# Patient Record
Sex: Male | Born: 1970 | Race: White | Hispanic: No | Smoking: Never smoker
Health system: Southern US, Community
[De-identification: ages and names within clinical notes are randomized; demographics above are authoritative.]

## PROBLEM LIST (undated history)

## (undated) DIAGNOSIS — I10 Essential (primary) hypertension: Secondary | ICD-10-CM

## (undated) DIAGNOSIS — F0280 Dementia in other diseases classified elsewhere without behavioral disturbance: Secondary | ICD-10-CM

## (undated) DIAGNOSIS — G4733 Obstructive sleep apnea (adult) (pediatric): Secondary | ICD-10-CM

## (undated) DIAGNOSIS — N200 Calculus of kidney: Secondary | ICD-10-CM

## (undated) DIAGNOSIS — R5600 Simple febrile convulsions: Secondary | ICD-10-CM

## (undated) DIAGNOSIS — G319 Degenerative disease of nervous system, unspecified: Secondary | ICD-10-CM

## (undated) DIAGNOSIS — E785 Hyperlipidemia, unspecified: Secondary | ICD-10-CM

## (undated) HISTORY — DX: Simple febrile convulsions: R56.00

## (undated) HISTORY — DX: Obstructive sleep apnea (adult) (pediatric): G47.33

## (undated) HISTORY — PX: LITHOTRIPSY: SUR834

## (undated) HISTORY — DX: Essential (primary) hypertension: I10

## (undated) HISTORY — DX: Hyperlipidemia, unspecified: E78.5

## (undated) HISTORY — DX: Calculus of kidney: N20.0

## (undated) HISTORY — DX: Dementia in other diseases classified elsewhere without behavioral disturbance: F02.80

## (undated) HISTORY — DX: Degenerative disease of nervous system, unspecified: G31.9

---

## 2004-07-08 ENCOUNTER — Emergency Department: Payer: Self-pay | Admitting: Emergency Medicine

## 2004-07-13 ENCOUNTER — Emergency Department: Payer: Self-pay | Admitting: Emergency Medicine

## 2013-05-20 DIAGNOSIS — N200 Calculus of kidney: Secondary | ICD-10-CM | POA: Insufficient documentation

## 2015-06-05 DIAGNOSIS — E785 Hyperlipidemia, unspecified: Secondary | ICD-10-CM | POA: Insufficient documentation

## 2015-06-05 DIAGNOSIS — E782 Mixed hyperlipidemia: Secondary | ICD-10-CM | POA: Insufficient documentation

## 2015-06-05 DIAGNOSIS — I10 Essential (primary) hypertension: Secondary | ICD-10-CM | POA: Insufficient documentation

## 2015-06-05 HISTORY — DX: Mixed hyperlipidemia: E78.2

## 2015-06-05 HISTORY — DX: Essential (primary) hypertension: I10

## 2017-06-03 DIAGNOSIS — Z0000 Encounter for general adult medical examination without abnormal findings: Secondary | ICD-10-CM | POA: Diagnosis not present

## 2017-06-03 DIAGNOSIS — G4710 Hypersomnia, unspecified: Secondary | ICD-10-CM | POA: Diagnosis not present

## 2017-08-04 DIAGNOSIS — H401131 Primary open-angle glaucoma, bilateral, mild stage: Secondary | ICD-10-CM | POA: Diagnosis not present

## 2017-09-18 DIAGNOSIS — H401131 Primary open-angle glaucoma, bilateral, mild stage: Secondary | ICD-10-CM | POA: Diagnosis not present

## 2017-11-07 DIAGNOSIS — I10 Essential (primary) hypertension: Secondary | ICD-10-CM | POA: Diagnosis not present

## 2017-11-17 DIAGNOSIS — E782 Mixed hyperlipidemia: Secondary | ICD-10-CM | POA: Diagnosis not present

## 2017-11-17 DIAGNOSIS — I10 Essential (primary) hypertension: Secondary | ICD-10-CM | POA: Diagnosis not present

## 2017-11-17 DIAGNOSIS — G4733 Obstructive sleep apnea (adult) (pediatric): Secondary | ICD-10-CM | POA: Diagnosis not present

## 2017-12-25 DIAGNOSIS — G4733 Obstructive sleep apnea (adult) (pediatric): Secondary | ICD-10-CM | POA: Diagnosis not present

## 2017-12-28 DIAGNOSIS — G4733 Obstructive sleep apnea (adult) (pediatric): Secondary | ICD-10-CM | POA: Diagnosis not present

## 2018-05-13 DIAGNOSIS — E782 Mixed hyperlipidemia: Secondary | ICD-10-CM | POA: Diagnosis not present

## 2018-05-20 DIAGNOSIS — Z0000 Encounter for general adult medical examination without abnormal findings: Secondary | ICD-10-CM | POA: Diagnosis not present

## 2018-05-20 DIAGNOSIS — G4733 Obstructive sleep apnea (adult) (pediatric): Secondary | ICD-10-CM | POA: Insufficient documentation

## 2018-06-04 DIAGNOSIS — M25562 Pain in left knee: Secondary | ICD-10-CM | POA: Diagnosis not present

## 2018-06-04 DIAGNOSIS — M25561 Pain in right knee: Secondary | ICD-10-CM | POA: Diagnosis not present

## 2018-06-04 DIAGNOSIS — R42 Dizziness and giddiness: Secondary | ICD-10-CM | POA: Diagnosis not present

## 2018-06-04 DIAGNOSIS — J302 Other seasonal allergic rhinitis: Secondary | ICD-10-CM | POA: Diagnosis not present

## 2018-11-18 ENCOUNTER — Emergency Department: Payer: BC Managed Care – PPO

## 2018-11-18 ENCOUNTER — Encounter: Payer: Self-pay | Admitting: Emergency Medicine

## 2018-11-18 ENCOUNTER — Other Ambulatory Visit: Payer: Self-pay

## 2018-11-18 ENCOUNTER — Emergency Department
Admission: EM | Admit: 2018-11-18 | Discharge: 2018-11-18 | Disposition: A | Discharge status: Home / Self Care | Payer: BC Managed Care – PPO | Attending: Student in an Organized Health Care Education/Training Program | Admitting: Student in an Organized Health Care Education/Training Program

## 2018-11-18 DIAGNOSIS — R42 Dizziness and giddiness: Secondary | ICD-10-CM | POA: Diagnosis not present

## 2018-11-18 DIAGNOSIS — Z733 Stress, not elsewhere classified: Secondary | ICD-10-CM | POA: Insufficient documentation

## 2018-11-18 DIAGNOSIS — Z20828 Contact with and (suspected) exposure to other viral communicable diseases: Secondary | ICD-10-CM | POA: Diagnosis not present

## 2018-11-18 DIAGNOSIS — R0602 Shortness of breath: Secondary | ICD-10-CM | POA: Diagnosis present

## 2018-11-18 DIAGNOSIS — M7989 Other specified soft tissue disorders: Secondary | ICD-10-CM | POA: Diagnosis not present

## 2018-11-18 DIAGNOSIS — R000 Tachycardia, unspecified: Secondary | ICD-10-CM | POA: Diagnosis not present

## 2018-11-18 LAB — CBC
HCT: 42.8 % (ref 39.0–52.0)
Hemoglobin: 14.2 g/dL (ref 13.0–17.0)
MCH: 29 pg (ref 26.0–34.0)
MCHC: 33.2 g/dL (ref 30.0–36.0)
MCV: 87.5 fL (ref 80.0–100.0)
Platelets: 466 10*3/uL — ABNORMAL HIGH (ref 150–400)
RBC: 4.89 MIL/uL (ref 4.22–5.81)
RDW: 12.2 % (ref 11.5–15.5)
WBC: 7.8 10*3/uL (ref 4.0–10.5)
nRBC: 0 % (ref 0.0–0.2)

## 2018-11-18 LAB — BASIC METABOLIC PANEL
Anion gap: 12 (ref 5–15)
BUN: 16 mg/dL (ref 6–20)
CO2: 23 mmol/L (ref 22–32)
Calcium: 9.3 mg/dL (ref 8.9–10.3)
Chloride: 103 mmol/L (ref 98–111)
Creatinine, Ser: 0.87 mg/dL (ref 0.61–1.24)
GFR calc Af Amer: >60 mL/min (ref >60)
GFR calc non Af Amer: >60 mL/min (ref >60)
Glucose, Bld: 112 mg/dL — ABNORMAL HIGH (ref 70–99)
Potassium: 4 mmol/L (ref 3.5–5.1)
Sodium: 138 mmol/L (ref 135–145)

## 2018-11-18 LAB — URINALYSIS, COMPLETE (UACMP) WITH MICROSCOPIC
Bacteria, UA: NONE SEEN
Bilirubin Urine: NEGATIVE
Glucose, UA: NEGATIVE mg/dL
Hgb urine dipstick: NEGATIVE
Ketones, ur: NEGATIVE mg/dL
Leukocytes,Ua: NEGATIVE
Nitrite: NEGATIVE
Protein, ur: NEGATIVE mg/dL
Specific Gravity, Urine: 1.021 (ref 1.005–1.030)
Squamous Epithelial / HPF: NONE SEEN (ref 0–5)
pH: 5 (ref 5.0–8.0)

## 2018-11-18 LAB — TROPONIN I (HIGH SENSITIVITY): Troponin I (High Sensitivity): 4 ng/L (ref <18)

## 2018-11-18 LAB — FIBRIN DERIVATIVES D-DIMER (ARMC ONLY): Fibrin derivatives D-dimer (ARMC): 2112.79 ng/mL (FEU) — ABNORMAL HIGH (ref 0.00–499.00)

## 2018-11-18 LAB — BRAIN NATRIURETIC PEPTIDE: B Natriuretic Peptide: 19 pg/mL (ref 0.0–100.0)

## 2018-11-18 MED ORDER — ALBUTEROL SULFATE HFA 108 (90 BASE) MCG/ACT IN AERS
2.0000 | INHALATION_SPRAY | Freq: Once | RESPIRATORY_TRACT | Status: AC
  Administered 2018-11-18: 19:00:00 2 via RESPIRATORY_TRACT
  Filled 2018-11-18 (×2): qty 6.7

## 2018-11-18 MED ORDER — SODIUM CHLORIDE 0.9 % IV BOLUS
500.0000 mL | Freq: Once | INTRAVENOUS | Status: AC
  Administered 2018-11-18: 19:00:00 500 mL via INTRAVENOUS

## 2018-11-18 MED ORDER — IOHEXOL 350 MG/ML SOLN
75.0000 mL | Freq: Once | INTRAVENOUS | Status: AC | PRN
  Administered 2018-11-18: 19:00:00 75 mL via INTRAVENOUS

## 2018-11-18 NOTE — ED Triage Notes (Signed)
PT from [facility] with c/o SOB and dizziness. Per [place] 95% on RA but became dizzy and tachycardic with ambulation. PT states symptoms started today. PT denies any CP , fevers or cough

## 2018-11-18 NOTE — ED Notes (Signed)
ED Provider at bedside. 

## 2018-11-18 NOTE — Discharge Instructions (Addendum)
Your blood tests, CT scan of the chest, and ultrasound of the legs were all unremarkable today.  We did not find any blood clots in your body that might explain your symptoms.  One blood test called D-dimer was elevated at 2100.  This finding could possibly be due to Covid infection, and if this test comes back positive when the result is available later tomorrow, we will give you a call.  In the meantime, please quarantine yourself at home until the test result is known.

## 2018-11-18 NOTE — ED Notes (Signed)
X-ray at bedside to perform chest X-ray.  

## 2018-11-18 NOTE — ED Notes (Signed)
[place] tech in with patient to preform [place].

## 2018-11-18 NOTE — ED Notes (Signed)
Pt ambulated in room. O2 sats remained above 99%

## 2018-11-18 NOTE — ED Notes (Signed)
Called pharmacy regarding albuterol inhaler. Still has not been tubed to ED. Pharmacy states will tube to ED.

## 2018-11-18 NOTE — ED Notes (Signed)
Message sent to pharmacy regarding pt's inhaler.

## 2018-11-18 NOTE — ED Notes (Signed)
Pt presents to ED via wheelchair from [facility]. Pt states at lunch today he noticed he was becoming SOB and dizzy while walking. Pt states only thing that makes the dizziness worse is ambulating, pt able to speak in full and complete sentences at this time without difficulty. Pt is A&O x 4, NAD noted at this time.

## 2018-11-18 NOTE — ED Provider Notes (Signed)
Procedures  Clinical Course as of Nov 18 2043  Wed Nov 18, 2018  1905 Work-up thus far is quite reassuring.  I do not appreciate any saddle PE on CT.  We will also order ultrasound of lower extremities.  Will wait for formal.  Patient does not have any dyspnea on exertion.  No tachycardia no hypoxia.  Anticipate if CT imaging is negative patient will be appropriate for discharge home.   [PR]    Clinical Course User Index [PR] Merlyn Lot, MD    ----------------------------------------- 8:45 PM on 11/18/2018 -----------------------------------------  Assumed care from Dr. Quentin Cornwall.  Ultrasound is negative.  Vital signs unremarkable, patient's breathing comfortably with normal oxygen saturation.  Patient is calm and sitting upright and not in distress, stable for discharge home for outpatient follow-up.  Advised to quarantine at home pending Covid test result.   Carrie Mew, MD 11/18/18 2045

## 2018-11-18 NOTE — ED Provider Notes (Signed)
[facility] Emergency Department Provider Note    First MD Initiated Contact with Patient 11/18/18 1623     (approximate)  I have reviewed the triage vital signs and the nursing notes.   HISTORY  Chief Complaint Shortness of Breath and Dizziness    HPI Chad Stone is a 48 y.o. male presents to the ER for evaluation of shortness of breath lightheadedness dizziness and exertional dyspnea.  States he is also been feeling overwhelmed as he has been on over going through quite a bit of stress at home.  [place] [facility] because with ambulation patient became lightheaded and was reportedly tachycardic.  Denies any chest pain.  No history of asthma.  Denies any history of heart disease.  No recent surgeries.  Does not have any fevers.  No productive cough.    History reviewed. No pertinent past medical history. No family history on file. History reviewed. No pertinent surgical history. There are no active problems to display for this patient.     Prior to Admission medications   Not on File    Allergies Patient has no known allergies.    Social History Social History   Tobacco Use  . Smoking status: Never Smoker  . Smokeless tobacco: Never Used  Substance Use Topics  . Alcohol use: Not Currently  . Drug use: Never    Review of Systems Patient denies headaches, rhinorrhea, blurry vision, numbness, shortness of breath, chest pain, edema, cough, abdominal pain, nausea, vomiting, diarrhea, dysuria, fevers, rashes or hallucinations unless otherwise stated above in HPI. ____________________________________________   PHYSICAL EXAM:  VITAL SIGNS: Vitals:   11/18/18 1900 11/18/18 1915  BP: (!) 133/101   Pulse: 79 74  Resp: 16 20  Temp:    SpO2: 100% 100%    Constitutional: Alert and oriented.  Eyes: Conjunctivae are normal.  Head: Atraumatic. Nose: No congestion/rhinnorhea. Mouth/Throat: Mucous membranes are  moist.   Neck: No stridor. Painless ROM.  Cardiovascular: Normal rate, regular rhythm. Grossly normal heart sounds.  Good peripheral circulation. Respiratory: Normal respiratory effort.  No retractions. Lungs CTAB. Gastrointestinal: Soft and nontender. No distention. No abdominal bruits. No CVA tenderness. Genitourinary:  Musculoskeletal: No lower extremity tenderness nor edema.  No joint effusions. Neurologic:  Normal speech and language. No gross focal neurologic deficits are appreciated. No facial droop Skin:  Skin is warm, dry and intact. No rash noted. Psychiatric: Mood and affect are normal. Speech and behavior are normal.  ____________________________________________   LABS (all labs ordered are listed, but only abnormal results are displayed)  Results for orders placed or performed during the hospital encounter of 11/18/18 (from the past 24 hour(s))  Basic metabolic panel     Status: Abnormal   Collection Time: 11/18/18  1:33 PM  Result Value Ref Range   Sodium 138 135 - 145 mmol/L   Potassium 4.0 3.5 - 5.1 mmol/L   Chloride 103 98 - 111 mmol/L   CO2 23 22 - 32 mmol/L   Glucose, Bld 112 (H) 70 - 99 mg/dL   BUN 16 6 - 20 mg/dL   Creatinine, Ser 1.610.87 0.61 - 1.24 mg/dL   Calcium 9.3 8.9 - 09.610.3 mg/dL   GFR calc non Af Amer >60 >60 mL/min   GFR calc Af Amer >60 >60 mL/min   Anion gap 12 5 - 15  CBC     Status: Abnormal   Collection Time: 11/18/18  1:33 PM  Result Value Ref Range  WBC 7.8 4.0 - 10.5 K/uL   RBC 4.89 4.22 - 5.81 MIL/uL   Hemoglobin 14.2 13.0 - 17.0 g/dL   HCT 97.3 53.2 - 99.2 %   MCV 87.5 80.0 - 100.0 fL   MCH 29.0 26.0 - 34.0 pg   MCHC 33.2 30.0 - 36.0 g/dL   RDW 42.6 83.4 - 19.6 %   Platelets 466 (H) 150 - 400 K/uL   nRBC 0.0 0.0 - 0.2 %  Urinalysis, Complete w Microscopic     Status: Abnormal   Collection Time: 11/18/18  5:28 PM  Result Value Ref Range   Color, Urine YELLOW (A) YELLOW   APPearance CLEAR (A) CLEAR   Specific Gravity, Urine 1.021  1.005 - 1.030   pH 5.0 5.0 - 8.0   Glucose, UA NEGATIVE NEGATIVE mg/dL   Hgb urine dipstick NEGATIVE NEGATIVE   Bilirubin Urine NEGATIVE NEGATIVE   Ketones, ur NEGATIVE NEGATIVE mg/dL   Protein, ur NEGATIVE NEGATIVE mg/dL   Nitrite NEGATIVE NEGATIVE   Leukocytes,Ua NEGATIVE NEGATIVE   RBC / HPF 0-5 0 - 5 RBC/hpf   WBC, UA 0-5 0 - 5 WBC/hpf   Bacteria, UA NONE SEEN NONE SEEN   Squamous Epithelial / LPF NONE SEEN 0 - 5   Mucus PRESENT   Brain natriuretic peptide     Status: None   Collection Time: 11/18/18  5:28 PM  Result Value Ref Range   B Natriuretic Peptide 19.0 0.0 - 100.0 pg/mL  Troponin I (High Sensitivity)     Status: None   Collection Time: 11/18/18  5:28 PM  Result Value Ref Range   Troponin I (High Sensitivity) 4 <18 ng/L  Fibrin derivatives D-Dimer ([facility] only)     Status: Abnormal   Collection Time: 11/18/18  5:28 PM  Result Value Ref Range   Fibrin derivatives D-dimer ([facility]) 2,112.79 (H) 0.00 - 499.00 ng/mL (FEU)   ____________________________________________  EKG My review and personal interpretation at Time: 13:32   Indication: sob  Rate: 85  Rhythm: sinus Axis: normal Other: poor r wave progression, no stemi ____________________________________________  RADIOLOGY  I personally reviewed all radiographic images ordered to evaluate for the above acute complaints and reviewed radiology reports and findings.  These findings were personally discussed with the patient.  Please see medical record for radiology report.  ____________________________________________   PROCEDURES  Procedure(s) performed:  Procedures    Critical Care performed: no ____________________________________________   INITIAL IMPRESSION / ASSESSMENT AND PLAN / ED COURSE  Pertinent labs & imaging results that were available during my care of the patient were reviewed by me and considered in my medical decision making (see chart for details).   DDX: Asthma, copd, CHF, pna, ptx,  malignancy, Pe, anemia   Chad Stone is a 48 y.o. who presents to the ED with symptoms as described above.  Blood work and imaging will be sent for the above differential.  He appears well and nontoxic.  No hypoxia no tachycardia.  Do not appreciate any significant wheezing.  Does have some trace pedal edema.  No known sick contacts.  Will screen for Covid.  No evidence of acute ischemia on his EKG and the patient is without chest pain or pressure.  No palpitations.  The patient will be placed on continuous pulse oximetry and telemetry for monitoring.  Laboratory evaluation will be sent to evaluate for the above complaints.   .  Clinical Course as of Nov 17 2013  Wed Nov 18, 2018  2229 Work-up  thus far is quite reassuring.  I do not appreciate any saddle PE on CT.  We will also order ultrasound of lower extremities.  Will wait for formal.  Patient does not have any dyspnea on exertion.  No tachycardia no hypoxia.  Anticipate if CT imaging is negative patient will be appropriate for discharge home.   [PR]    Clinical Course User Index [PR] Merlyn Lot, MD    Patient family updated on results.  Patient signed out to oncoming physician pending results of ultrasound.  Anticipate discharge home if negative.  The patient was evaluated in Emergency Department today for the symptoms described in the history of present illness. He/she was evaluated in the context of the global COVID-19 pandemic, which necessitated consideration that the patient might be at risk for infection with the SARS-CoV-2 virus that causes COVID-19. Institutional protocols and algorithms that pertain to the evaluation of patients at risk for COVID-19 are in a state of rapid change based on information released by regulatory bodies including the CDC and federal and state organizations. These policies and algorithms were followed during the patient's care in the ED.  As part of my medical decision making, I reviewed the  following data within the [place] notes reviewed and incorporated, Labs reviewed, notes from prior ED visits and [place] Controlled Substance Database   ____________________________________________   FINAL CLINICAL IMPRESSION(S) / ED DIAGNOSES  Final diagnoses:  Shortness of breath      NEW MEDICATIONS STARTED DURING THIS VISIT:  New Prescriptions   No medications on file     Note:  This document was prepared using Dragon voice recognition software and may include unintentional dictation errors.    Merlyn Lot, MD 11/18/18 2017

## 2018-11-18 NOTE — ED Notes (Signed)
Patient transported to CT 

## 2018-11-19 LAB — SARS CORONAVIRUS 2 (TAT 6-24 HRS): SARS Coronavirus 2: NEGATIVE

## 2018-11-27 ENCOUNTER — Other Ambulatory Visit: Payer: Self-pay | Admitting: Internal Medicine

## 2018-11-27 DIAGNOSIS — G9349 Other encephalopathy: Secondary | ICD-10-CM

## 2018-11-27 DIAGNOSIS — R410 Disorientation, unspecified: Secondary | ICD-10-CM

## 2018-12-11 ENCOUNTER — Other Ambulatory Visit: Payer: Self-pay

## 2018-12-11 ENCOUNTER — Ambulatory Visit
Admission: RE | Admit: 2018-12-11 | Discharge: 2018-12-11 | Disposition: A | Discharge status: Home / Self Care | Payer: BC Managed Care – PPO | Source: Ambulatory Visit | Attending: Internal Medicine | Admitting: Internal Medicine

## 2018-12-11 DIAGNOSIS — G9349 Other encephalopathy: Secondary | ICD-10-CM | POA: Insufficient documentation

## 2018-12-11 DIAGNOSIS — R410 Disorientation, unspecified: Secondary | ICD-10-CM | POA: Diagnosis not present

## 2018-12-11 MED ORDER — GADOBUTROL 1 MMOL/ML IV SOLN
10.0000 mL | Freq: Once | INTRAVENOUS | Status: AC | PRN
  Administered 2018-12-11: 10 mL via INTRAVENOUS

## 2018-12-14 DIAGNOSIS — G319 Degenerative disease of nervous system, unspecified: Secondary | ICD-10-CM | POA: Insufficient documentation

## 2018-12-14 HISTORY — DX: Degenerative disease of nervous system, unspecified: G31.9

## 2018-12-16 ENCOUNTER — Other Ambulatory Visit: Payer: Self-pay | Admitting: Internal Medicine

## 2018-12-16 ENCOUNTER — Other Ambulatory Visit (HOSPITAL_COMMUNITY): Payer: Self-pay | Admitting: Internal Medicine

## 2018-12-16 DIAGNOSIS — M5116 Intervertebral disc disorders with radiculopathy, lumbar region: Secondary | ICD-10-CM

## 2018-12-17 ENCOUNTER — Ambulatory Visit
Admission: RE | Admit: 2018-12-17 | Discharge: 2018-12-17 | Disposition: A | Discharge status: Home / Self Care | Payer: BC Managed Care – PPO | Source: Ambulatory Visit | Attending: Internal Medicine | Admitting: Internal Medicine

## 2018-12-17 ENCOUNTER — Other Ambulatory Visit: Payer: Self-pay

## 2018-12-17 DIAGNOSIS — M5116 Intervertebral disc disorders with radiculopathy, lumbar region: Secondary | ICD-10-CM | POA: Diagnosis present

## 2018-12-21 NOTE — Progress Notes (Signed)
Cardiology Office Note  Date:  12/22/2018   ID:  Chad Stone, DOB October 28, 1970, MRN [id number]  PCP:  Chad Aus, MD   Chief Complaint  Patient presents with  . other    Self ref for arrhythmia showing on an EKG with Dr. Emily Stone. Meds reviewed by the pt. verbally. Pt. c/o shortness of breath and chest discomfort.     HPI:  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 Left frontal meningioma measuring 1 cm in size.  Echocardiogram performed to primary care, [place] NORMAL LEFT VENTRICULAR SYSTOLIC FUNCTION NORMAL RIGHT VENTRICULAR SYSTOLIC FUNCTION MILD VALVULAR REGURGITATION (See above) NO VALVULAR STENOSIS  Total chol higher, 265, LDL 180  EKG personally reviewed by myself on todays visit NSr rate 84 no ST or T wave changes  PMH:   has a past medical history of Benign essential HTN, Cerebral atrophy, mild ([place]), Hyperlipidemia, Nephrolithiasis, and OSA (obstructive sleep apnea).  PSH:    Past Surgical History:  Procedure Laterality Date  . LITHOTRIPSY      Current Outpatient Medications  Medication Sig Dispense Refill  . albuterol (VENTOLIN HFA) 108 (90 Base) MCG/ACT inhaler Inhale 1-2 puffs into the lungs every 4 (four) hours as needed.     [place] amphetamine-dextroamphetamine (ADDERALL) 10 MG tablet Take 10 mg by mouth daily with breakfast.     . azelastine (ASTELIN) 0.1 % nasal spray Place 1 spray into both nostrils 2  (two) times daily.     [place] escitalopram (LEXAPRO) 10 MG tablet Take 10 mg by mouth daily.    [place] etodolac (LODINE) 500 MG tablet Take 500 mg by mouth 2 (two) times daily.    [place] latanoprost (XALATAN) 0.005 % ophthalmic solution Place 1 drop into both eyes at bedtime.     . Naproxen Sodium (ALEVE PO) Take 500 mg elemental calcium/kg/hr by mouth daily as needed.    [place] olmesartan-hydrochlorothiazide (BENICAR HCT) 20-12.5 MG tablet Take 1 tablet by mouth daily.    . predniSONE (DELTASONE) 20 MG tablet Take 20 mg by mouth daily.    . sildenafil (REVATIO) 20 MG tablet Take 20 mg by mouth 3 (three) times daily as needed.     . venlafaxine XR (EFFEXOR-XR) 75 MG 24 hr capsule Take 75 mg by mouth daily with breakfast.      No current facility-administered medications for this visit.      Allergies:   Patient has no known allergies.   Social History:  The patient  reports that he has never smoked. He has never used smokeless tobacco. He reports previous alcohol use. He reports that he does not use drugs.   Family History:   family history includes Arrhythmia in his father.    Review of Systems: ROS   PHYSICAL EXAM: VS:  Temp 97.9 F (36.6 C)   Ht 6' (1.829 m)   Wt 274 lb (124.3 kg)   BMI 37.16 kg/m  , BMI Body mass index is 37.16 kg/m. GEN: Well nourished, well developed, in no  acute distress HEENT: normal Neck: no JVD, carotid bruits, or masses Cardiac: RRR; no murmurs, rubs, or gallops,no edema  Respiratory:  clear to auscultation bilaterally, normal work of breathing GI: soft, nontender, nondistended, + BS MS: no deformity or atrophy Skin: warm and dry, no rash Neuro:  Strength and sensation are intact Psych: euthymic mood, full affect    Recent Labs: 11/18/2018: B Natriuretic Peptide 19.0; BUN 16; Creatinine, Ser 0.87; Hemoglobin 14.2; Platelets 466; Potassium 4.0; Sodium 138    Lipid Panel No results found for: CHOL, HDL, LDLCALC, TRIG    Wt Readings from Last 3 Encounters:   12/22/18 274 lb (124.3 kg)  11/18/18 269 lb (122 kg)       ASSESSMENT AND PLAN:  Problem List Items Addressed This Visit    None    Visit Diagnoses    Cardiac arrhythmia, unspecified cardiac arrhythmia type    -  Primary   Relevant Medications   olmesartan-hydrochlorothiazide (BENICAR HCT) 20-12.5 MG tablet   sildenafil (REVATIO) 20 MG tablet   Frontotemporal dementia (HCC)         Cardiac arrhythmia Denies palpitations, normal EKG EKG requested from primary care, this appears normal No further work-up needed  Hyperlipidemia Although CT scan reviewed with him images pulled up in the office today with no carotid calcification no aortic atherosclerosis no coronary calcification, His numbers are elevated and trending higher likely given sedentary lifestyle weight gain -Given some family history we have recommended Crestor 10 mg daily Lifestyle modification discussed with him  Frontotemporal dementia Relatively well functioning, followed by neurology  Disposition:   F/U as needed   Total encounter time more than 45 minutes  Greater than 50% was spent in counseling and coordination of care with the patient    Signed, Chad Stone, M.D., Ph.D. [facility] [place], [place] [phone]

## 2018-12-22 ENCOUNTER — Other Ambulatory Visit: Payer: Self-pay

## 2018-12-22 ENCOUNTER — Encounter: Payer: Self-pay | Admitting: Cardiovascular Disease

## 2018-12-22 ENCOUNTER — Ambulatory Visit (INDEPENDENT_AMBULATORY_CARE_PROVIDER_SITE_OTHER): Payer: BC Managed Care – PPO | Admitting: Cardiovascular Disease

## 2018-12-22 VITALS — BP 120/80 | HR 84 | Temp 97.9°F | Ht 72.0 in | Wt 274.0 lb

## 2018-12-22 DIAGNOSIS — F0280 Dementia in other diseases classified elsewhere without behavioral disturbance: Secondary | ICD-10-CM

## 2018-12-22 DIAGNOSIS — G3109 Other frontotemporal dementia: Secondary | ICD-10-CM

## 2018-12-22 DIAGNOSIS — I499 Cardiac arrhythmia, unspecified: Secondary | ICD-10-CM

## 2018-12-22 MED ORDER — ROSUVASTATIN CALCIUM 10 MG PO TABS: 10.0000 mg | ORAL_TABLET | Freq: Every day | ORAL | 3 refills | Status: DC

## 2018-12-22 NOTE — Patient Instructions (Addendum)
EKG normal CT scan chest with no blockages from carotid to top of kidneys (through the heart, no aorta plaque). Echo normal, heart sounds wonderful  Cholesterol HIGH! Will start a low dose pill to treat  Medication Instructions:   please start crestor 10 mg daily  If you need a refill on your cardiac medications before your next appointment, please call your pharmacy.    Lab work: No new labs needed   If you have labs (blood work) drawn today and your tests are completely normal, you will receive your results only by: [place] MyChart Message (if you have MyChart) OR . A paper copy in the mail If you have any lab test that is abnormal or we need to change your treatment, we will call you to review the results.   Testing/Procedures: No new testing needed   Follow-Up: At [facility], you and your health needs are our priority.  As part of our continuing mission to provide you with exceptional heart care, we have created designated Provider Care Teams.  These Care Teams include your primary Cardiologist (physician) and Advanced Practice Providers (APPs -  Physician Assistants and Nurse Practitioners) who all work together to provide you with the care you need, when you need it.  . You will need a follow up appointment as needed   . Providers on your designated Care Team:   . Murray Hodgkins, NP . Christell Faith, PA-C . Marrianne Mood, PA-C  Any Other Special Instructions Will Be Listed Below (If Applicable).  For educational health videos Log in to : [email or web address] Or : [email or web address], password : [email or web address]

## 2019-09-17 ENCOUNTER — Other Ambulatory Visit: Payer: Self-pay | Admitting: Internal Medicine

## 2019-09-17 DIAGNOSIS — M7989 Other specified soft tissue disorders: Secondary | ICD-10-CM

## 2019-09-23 ENCOUNTER — Ambulatory Visit: Payer: Self-pay

## 2019-09-30 ENCOUNTER — Ambulatory Visit
Admission: RE | Admit: 2019-09-30 | Discharge: 2019-09-30 | Disposition: A | Discharge status: Home / Self Care | Payer: 59 | Source: Ambulatory Visit | Attending: Internal Medicine | Admitting: Internal Medicine

## 2019-09-30 ENCOUNTER — Other Ambulatory Visit: Payer: Self-pay

## 2019-09-30 DIAGNOSIS — M7989 Other specified soft tissue disorders: Secondary | ICD-10-CM | POA: Insufficient documentation

## 2019-12-28 ENCOUNTER — Other Ambulatory Visit: Payer: Self-pay | Admitting: Internal Medicine

## 2019-12-28 DIAGNOSIS — R55 Syncope and collapse: Secondary | ICD-10-CM

## 2020-01-03 ENCOUNTER — Ambulatory Visit
Admission: RE | Admit: 2020-01-03 | Discharge: 2020-01-03 | Disposition: A | Discharge status: Home / Self Care | Payer: 59 | Source: Ambulatory Visit | Attending: Internal Medicine | Admitting: Internal Medicine

## 2020-01-03 ENCOUNTER — Other Ambulatory Visit: Payer: Self-pay

## 2020-01-03 DIAGNOSIS — R55 Syncope and collapse: Secondary | ICD-10-CM

## 2020-03-09 ENCOUNTER — Encounter: Payer: Self-pay | Admitting: Cardiovascular Disease

## 2020-04-06 ENCOUNTER — Other Ambulatory Visit: Admission: RE | Admit: 2020-04-06 | Payer: 59 | Source: Ambulatory Visit

## 2020-04-10 ENCOUNTER — Ambulatory Visit: Admit: 2020-04-10 | Payer: 59 | Admitting: Internal Medicine

## 2020-04-10 SURGERY — COLONOSCOPY WITH PROPOFOL
Anesthesia: General

## 2020-07-18 ENCOUNTER — Ambulatory Visit: Payer: Medicaid Other

## 2020-07-25 ENCOUNTER — Ambulatory Visit: Payer: Medicaid Other

## 2020-07-25 ENCOUNTER — Ambulatory Visit: Payer: Medicaid Other | Attending: Physical Medicine and Rehabilitation

## 2020-07-25 DIAGNOSIS — M545 Low back pain, unspecified: Secondary | ICD-10-CM | POA: Insufficient documentation

## 2020-07-25 DIAGNOSIS — G8929 Other chronic pain: Secondary | ICD-10-CM | POA: Diagnosis present

## 2020-07-25 NOTE — Therapy (Signed)
[facility] [address], [place], [address] Phone: [phone]   Fax:  [phone]  Physical Therapy Evaluation  Patient Details  Name: Chad Stone MRN: [id number] Date of Birth: 07/19/1970 Referring Provider (PT): Romero Belling, MD  Encounter Date: 07/25/2020   PT End of Session - 07/25/20 0803     Visit Number 1    Number of Visits 17    Date for PT Re-Evaluation 09/21/20    Authorization Type 1    PT Start Time 0803    PT Stop Time 0845    PT Time Calculation (min) 42 min    Activity Tolerance Patient tolerated treatment well    Behavior During Therapy [facility] for tasks assessed/performed             Past Medical History:  Diagnosis Date   Benign essential HTN    Cerebral atrophy, mild (HCC)    Fronto-temporal dementia (HCC)    Hyperlipidemia    Nephrolithiasis    OSA (obstructive sleep apnea)     Past Surgical History:  Procedure Laterality Date   LITHOTRIPSY      There were no vitals filed for this visit.    Subjective Assessment - 07/25/20 0810     Subjective Low back (mainly on R side): 0/10 currently (pt sitting on a chair), 10/10 at most about a month ago.    Pertinent History Low back pain. Pain began about a couple of months ago, mainly on the R low back. Had a couple of shots in low back last Thursday which seems to be helping. Denies LE paresthesia, or loss of bowel or bladder control. Has had the same back pain about 2 years ago. Has not yet had PT.    Patient Stated Goals Decrease pain. Be able to pick up stuff.    Currently in Pain? No/denies    Pain Score 0-No pain    Pain Location Back    Pain Orientation Right;Posterior;Lower    Pain Type Acute pain   Acute on chronic   Pain Onset More than a month ago    Pain Frequency Occasional    Aggravating Factors  Picking up stuff from the ground    Pain Relieving Factors Sitting, shots in back. Putting pressure in R  low back with hand or pillow.                [facility] PT Assessment - 07/25/20 5701       Assessment   Medical Diagnosis Lumbar Spondylosis    Referring Provider (PT) Romero Belling, MD    Onset Date/Surgical Date 07/13/20    Prior Therapy None for current condition      Precautions   Precaution Comments No known precautions      Restrictions   Other Position/Activity Restrictions No known restrictions      Balance Screen   Has the patient fallen in the past 6 months No    Has the patient had a decrease in activity level because of a fear of falling?  No    Is the patient reluctant to leave their home because of a fear of falling?  No      Home Environment   Additional Comments Pt lives in a two story home alone. 4 steps to enter side door B rail. First floor set up.      Observation/Other Assessments   Focus on Therapeutic Outcomes (FOTO)  66 Lumbar  Posture/Postural Control   Posture Comments Forward neck, L shoulder lower, bilaterally protracted shoulders, slight L lateral shift, R anterior pelvic rotation, L foot pronation, L knee genu valgus, decreased B hip extension      AROM   Lumbar Flexion WFL with reproduction of symptoms with repeated flexion test    Lumbar Extension WFL, no pain    Lumbar - Right Side Bend WFL    Lumbar - Left Side [facility] with R low back pain    Lumbar - Right Rotation WFL    Lumbar - Left Rotation [facility]      Strength   Right Hip Flexion 4/5    Right Hip Extension 3+/5    Right Hip ABduction 4/5    Left Hip Flexion 4/5    Left Hip Extension 4/5    Left Hip ABduction 4/5    Right Knee Flexion 5/5    Right Knee Extension 5/5    Left Knee Flexion 5/5    Left Knee Extension 5/5      Palpation   Palpation comment TTP R PSIS with reproduction of symptoms.      Special Tests   Other special tests (+) repeated flexion test; Long sit test suggests posterior nutation of R innominate      Ambulation/Gait   Gait Comments Gait:  decrease stance L LE, B trendelenburg, decreased B hip extension                        Objective measurements completed on examination: See above findings.   No latex allergies Blood pressure is controlled per pt    Manual L lateral shift with PT: increased R Low back symptoms  Manual R lateral shift (L lateral shift correction): no symptoms.    Therapeutic exercise Seated R hip flexion isometrics 10x5 seconds for 2 sets  Improved exercise technique, movement at target joints, use of target muscles after mod verbal, visual, tactile cues.    Response to treatment Pt tolerated session well without aggravation of symptoms.     Clinical impression Pt is a 50 year old male who came to PT secondary to acute on chronic R low back pain. He also presents with altered gait pattern and posture, decreased trunk and bilateral hip strength, TTP R PSIS with reproduction of symptoms, reproduction of symptoms with lumbar flexion and L side bending, positive special tests suggesting lumbopelvic involvement, and difficulty performing tasks which involve bending over such as picking up items from the ground. Pt will benefit from skilled physical therapy services to address the aforementioned deficits.                PT Education - 07/25/20 1353     Education Details ther-ex, plan of care, HEP    Person(s) Educated Patient    Methods Explanation;Demonstration;Tactile cues;Verbal cues;Handout    Comprehension Verbalized understanding;Returned demonstration              PT Short Term Goals - 07/25/20 1055       PT SHORT TERM GOAL #1   Title Pt will be independent with his initial HEP to decrease pain, improve strength and function.    Baseline Pt has started his HEP (07/25/2020)    Time 3    Period Weeks    Status New    Target Date 08/17/20               PT Long Term Goals - 07/25/20 1334  PT LONG TERM GOAL #1   Title Patient will have a  decrease in low back pain to 5/10 or less at worst to promote ability to perform tasks which involve bending over such as picking up items from the floor more comfortably.    Baseline 10/10 low back pain at most about 1 month ago (07/25/2020)    Time 8    Period Weeks    Status New    Target Date 09/21/20      PT LONG TERM GOAL #2   Title Patient will improve his FOTO score by at least 10 points as a demonstration of improved function.    Baseline FOTO lumbar spine 66 (07/25/2020)    Time 8    Period Weeks    Status New    Target Date 09/21/20      PT LONG TERM GOAL #3   Title Patient will improve his hip extension and abduction strength by at least 1/2 MMT grade to promote ability to lift items from the floor more comfortably for his back.    Baseline Hip extension 3+/5 R, 4/5 L, hip abduction 4/5 R, 4/5 L (07/25/2020)    Time 8    Period Weeks    Status New    Target Date 09/21/20                    Plan - 07/25/20 0900     Clinical Impression Statement Pt is a 50 year old male who came to PT secondary to acute on chronic R low back pain. He also presents with altered gait pattern and posture, decreased trunk and bilateral hip strength, TTP R PSIS with reproduction of symptoms, reproduction of symptoms with lumbar flexion and L side bending, positive special tests suggesting lumbopelvic involvement, and difficulty performing tasks which involve bending over such as picking up items from the ground. Pt will benefit from skilled physical therapy services to address the aforementioned deficits.    Personal Factors and Comorbidities Age;Comorbidity 3+;Fitness;Past/Current Experience    Comorbidities HTN, fronto-temporal dementia, nephrolithiasis, mild cerebral atrophy    Examination-Activity Limitations Bend;Lift    Stability/Clinical Decision Making Stable/Uncomplicated    Clinical Decision Making Low    Rehab Potential Fair    PT Frequency 2x / week    PT Duration 8 weeks     PT Treatment/Interventions Therapeutic activities;Therapeutic exercise;Neuromuscular re-education;Patient/family education;Manual techniques;Dry needling;Spinal Manipulations;Joint Manipulations;Electrical Stimulation;Iontophoresis 4mg /ml Dexamethasone;Traction    PT Next Visit Plan posture, trunk, and hip strengthening, lumbopelvic control, manual techniques, modlities PRN    Consulted and Agree with Plan of Care Patient             Patient will benefit from skilled therapeutic intervention in order to improve the following deficits and impairments:  Pain, Postural dysfunction, Improper body mechanics, Decreased strength  Visit Diagnosis: Chronic right-sided low back pain, unspecified whether sciatica present - Plan: PT plan of care cert/re-cert     Problem List Patient Active Problem List   Diagnosis Date Noted   Cerebral atrophy, mild (HCC) 12/14/2018   OSA (obstructive sleep apnea) 05/20/2018   Benign essential hypertension 06/05/2015   Hyperlipidemia, mixed 06/05/2015   Nephrolithiasis 05/20/2013    07/20/2013 PT, DPT   07/25/2020, 1:56 PM  [place] [facility] [address], [place] Phone: [phone]   Fax:  [phone]  Name: Chad Stone MRN: Sharlot Gowda Date of Birth: Mar 22, 1970

## 2020-07-25 NOTE — Patient Instructions (Signed)
Seated right hip flexion isometrics.    Sitting on a chair  R hand on top of your right knee  Lift your right knee up while your right hand keeps it from moving.    Hold it for 5 seconds   Repeat 10 times  Perform 3 sets daily

## 2020-08-01 ENCOUNTER — Ambulatory Visit: Payer: Medicaid Other

## 2020-08-01 DIAGNOSIS — M545 Low back pain, unspecified: Secondary | ICD-10-CM | POA: Diagnosis not present

## 2020-08-01 DIAGNOSIS — G8929 Other chronic pain: Secondary | ICD-10-CM

## 2020-08-01 NOTE — Patient Instructions (Signed)
Access Code: [email or web address] URL: [email or web address] Date: 08/01/2020 Prepared by: Loralyn Freshwater  Exercises Standing Lumbar Extension - 1 x daily - 7 x weekly - 3 sets - 10 reps - 5 seconds hold

## 2020-08-01 NOTE — Therapy (Signed)
[facility] [address] [place], [address] Phone: [phone]   Fax:  [phone]  Physical Therapy Treatment  Patient Details  Name: Chad Stone MRN: [id number] Date of Birth: 1971/01/14 Referring Provider (PT): Romero Belling, MD   Encounter Date: 08/01/2020   PT End of Session - 08/01/20 0854     Visit Number 2    Number of Visits 17    Date for PT Re-Evaluation 09/21/20    Authorization Type 2    Authorization Time Period 4    PT Start Time 0854    PT Stop Time 0936    PT Time Calculation (min) 42 min    Activity Tolerance Patient tolerated treatment well    Behavior During Therapy [facility] for tasks assessed/performed             Past Medical History:  Diagnosis Date   Benign essential HTN    Cerebral atrophy, mild (HCC)    Fronto-temporal dementia (HCC)    Hyperlipidemia    Nephrolithiasis    OSA (obstructive sleep apnea)     Past Surgical History:  Procedure Laterality Date   LITHOTRIPSY      There were no vitals filed for this visit.   Subjective Assessment - 08/01/20 0855     Subjective Back is doing good. No pain currently.    Pertinent History Low back pain. Pain began about a couple of months ago, mainly on the R low back. Had a couple of shots in low back last Thursday which seems to be helping. Denies LE paresthesia, or loss of bowel or bladder control. Has had the same back pain about 2 years ago. Has not yet had PT.    Patient Stated Goals Decrease pain. Be able to pick up stuff.    Currently in Pain? No/denies    Pain Onset More than a month ago                                       PT Education - 08/01/20 0901     Education Details ther-ex, HEP    Person(s) Educated Patient    Methods Explanation;Demonstration;Tactile cues;Verbal cues;Handout    Comprehension Returned demonstration;Verbalized understanding              Objective   .    No latex allergies Blood pressure is controlled per pt     Manual L lateral shift with PT: increased R Low back symptoms   Manual R lateral shift (L lateral shift correction): no symptoms.   Medbridge Access Code [email or web address]    Therapeutic exercise  Standing back extension 10x5 seconds for 3 sets  Standing R lumbar side bend 10x5 seconds for 3 sets    Seated R hip flexion isometrics 10x5 seconds for 2 sets  Seated manually resisted R lateral shift isometrics, PT manual resistance to correct posture 10x5 seconds   Seated manually resisted R upper trunk rotation isometrics to promote L lower trunk rotation and more neutral lumbar posture 10x3 with 5 second holds   Seated B shoulder extension isometrics, hands on thighs 10x3 with 5 second holds    Standing B shoulder low rows with B scapular retraction red band 10x5 seconds   Hooklying hip extension isometrics, leg straight  R 10x5 seconds for 3 sets  Hooklying transversus abdominis contraction 5 seconds  B hamstring cramps  Seated trunk extension isometrics on chair 10x5 seconds for 3 sets     Improved exercise technique, movement at target joints, use of target muscles after mod verbal, visual, tactile cues.      Response to treatment Pt tolerated session well without aggravation of symptoms.        Clinical impression Pt demonstrates extension preference for his back with reproduction of symptoms with flexion related movements. Continued with extension based exercises with trunk strengthening to decrease stress to low back structures. Possible disc involvement. Pt tolerated session well without aggravation of symptoms. Pt will benefit from skilled physical therapy services to decrease pain, improve strength and function.        PT Short Term Goals - 07/25/20 1055       PT SHORT TERM GOAL #1   Title Pt will be independent with his initial HEP to decrease pain, improve strength and  function.    Baseline Pt has started his HEP (07/25/2020)    Time 3    Period Weeks    Status New    Target Date 08/17/20               PT Long Term Goals - 07/25/20 1334       PT LONG TERM GOAL #1   Title Patient will have a decrease in low back pain to 5/10 or less at worst to promote ability to perform tasks which involve bending over such as picking up items from the floor more comfortably.    Baseline 10/10 low back pain at most about 1 month ago (07/25/2020)    Time 8    Period Weeks    Status New    Target Date 09/21/20      PT LONG TERM GOAL #2   Title Patient will improve his FOTO score by at least 10 points as a demonstration of improved function.    Baseline FOTO lumbar spine 66 (07/25/2020)    Time 8    Period Weeks    Status New    Target Date 09/21/20      PT LONG TERM GOAL #3   Title Patient will improve his hip extension and abduction strength by at least 1/2 MMT grade to promote ability to lift items from the floor more comfortably for his back.    Baseline Hip extension 3+/5 R, 4/5 L, hip abduction 4/5 R, 4/5 L (07/25/2020)    Time 8    Period Weeks    Status New    Target Date 09/21/20                   Plan - 08/01/20 0901     Clinical Impression Statement Pt demonstrates extension preference for his back with reproduction of symptoms with flexion related movements. Continued with extension based exercises with trunk strengthening to decrease stress to low back structures. Possible disc involvement. Pt tolerated session well without aggravation of symptoms. Pt will benefit from skilled physical therapy services to decrease pain, improve strength and function.    Personal Factors and Comorbidities Age;Comorbidity 3+;Fitness;Past/Current Experience    Comorbidities HTN, fronto-temporal dementia, nephrolithiasis, mild cerebral atrophy    Examination-Activity Limitations Bend;Lift    Stability/Clinical Decision Making Stable/Uncomplicated     Rehab Potential Fair    PT Frequency 2x / week    PT Duration 8 weeks    PT Treatment/Interventions Therapeutic activities;Therapeutic exercise;Neuromuscular re-education;Patient/family education;Manual techniques;Dry needling;Spinal Manipulations;Joint Manipulations;Electrical Stimulation;Iontophoresis 4mg /ml Dexamethasone;Traction    PT Next Visit Plan posture,  trunk, and hip strengthening, lumbopelvic control, manual techniques, modlities PRN    Consulted and Agree with Plan of Care Patient             Patient will benefit from skilled therapeutic intervention in order to improve the following deficits and impairments:  Pain, Postural dysfunction, Improper body mechanics, Decreased strength  Visit Diagnosis: Chronic right-sided low back pain, unspecified whether sciatica present     Problem List Patient Active Problem List   Diagnosis Date Noted   Cerebral atrophy, mild (HCC) 12/14/2018   OSA (obstructive sleep apnea) 05/20/2018   Benign essential hypertension 06/05/2015   Hyperlipidemia, mixed 06/05/2015   Nephrolithiasis 05/20/2013    Loralyn Freshwater PT, DPT   08/01/2020, 9:52 AM  [facility] [address], [place], [address] Phone: [phone]   Fax:  [phone]  Name: LATIF NAZARENO MRN: [id number] Date of Birth: 21-Apr-1970

## 2020-08-03 ENCOUNTER — Ambulatory Visit: Payer: Medicaid Other

## 2020-08-03 DIAGNOSIS — M545 Low back pain, unspecified: Secondary | ICD-10-CM | POA: Diagnosis not present

## 2020-08-03 DIAGNOSIS — G8929 Other chronic pain: Secondary | ICD-10-CM

## 2020-08-03 NOTE — Patient Instructions (Signed)
Access Code: [email or web address] URL: [email or web address] Date: 08/03/2020 Prepared by: Loralyn Freshwater  Exercises Standing Lumbar Extension - 1 x daily - 7 x weekly - 3 sets - 10 reps - 5 seconds hold Standing Sidebends - 1 x daily - 7 x weekly - 3 sets - 10 reps - 5 seconds hold

## 2020-08-03 NOTE — Therapy (Signed)
[place] [facility] [address] [place], [address] Phone: [phone]   Fax:  [phone]  Physical Therapy Treatment  Patient Details  Name: Chad Stone MRN: [id number] Date of Birth: 1970/03/08 Referring Provider (PT): Romero Belling, MD   Encounter Date: 08/03/2020   PT End of Session - 08/03/20 0851     Visit Number 3    Number of Visits 17    Date for PT Re-Evaluation 09/21/20    Authorization Type 3    Authorization Time Period 4    PT Start Time 0851    PT Stop Time 0931    PT Time Calculation (min) 40 min    Activity Tolerance Patient tolerated treatment well    Behavior During Therapy [facility] for tasks assessed/performed             Past Medical History:  Diagnosis Date   Benign essential HTN    Cerebral atrophy, mild (HCC)    Fronto-temporal dementia (HCC)    Hyperlipidemia    Nephrolithiasis    OSA (obstructive sleep apnea)     Past Surgical History:  Procedure Laterality Date   LITHOTRIPSY      There were no vitals filed for this visit.   Subjective Assessment - 08/03/20 0852     Subjective Back is doing good. Was good after last session. Dr. Aram Beecham knows about is difficulty with holding urine in. Pt states that he drinks too much water. Pt states he knows that it is happening. Denies saddle anesthesia. Pt states that he does not make it to the rest room in time.    Pertinent History Low back pain. Pain began about a couple of months ago, mainly on the R low back. Had a couple of shots in low back last Thursday which seems to be helping. Denies LE paresthesia, or loss of bowel or bladder control. Has had the same back pain about 2 years ago. Has not yet had PT.    Patient Stated Goals Decrease pain. Be able to pick up stuff.    Currently in Pain? No/denies    Pain Onset More than a month ago                                       PT  Education - 08/03/20 0855     Education Details ther-ex, HEP    Person(s) Educated Patient    Methods Explanation;Demonstration;Tactile cues;Verbal cues;Handout    Comprehension Returned demonstration;Verbalized understanding             Objective   .    No latex allergies Blood pressure is controlled per pt     Manual L lateral shift with PT: increased R Low back symptoms   Manual R lateral shift (L lateral shift correction): no symptoms.   Medbridge Access Code [email or web address]     Therapeutic exercise   Standing back extension 10x5 seconds for 2 sets   Standing R lumbar side bend 10x5 seconds for 3 sets    Seated manually resisted R lateral shift isometrics, PT manual resistance to correct posture 10x5 seconds  Seated manually resisted R upper trunk rotation isometrics to promote L lower trunk rotation and more neutral lumbar posture 10x3 with 5 second holds   Hooklying transversus abdominis contraction 10x5 seconds for 3 sets   Max cues for muscle activation.  Hooklying clamshells charcoal band 2x. R hamstring cramp. Eases with rest  Seated B hip adduction isometrics, pt hand manual resistance 10x5 seconds for 2 sets     Improved exercise technique, movement at target joints, use of target muscles after mod verbal, visual, tactile cues.      Response to treatment Pt tolerated session well without aggravation of symptoms.        Clinical impression Pt demonstrates significant core weakness with max cues to activate transversus abdominis muscle. Continued working on extension and R side bending exercises secondary to directional preference. Pt tolerated session well without aggravation of symptoms. Pt will benefit from skilled physical therapy services to decrease pain, improve strength and function.       PT Short Term Goals - 07/25/20 1055       PT SHORT TERM GOAL #1   Title Pt will be independent with his initial HEP to decrease pain, improve strength  and function.    Baseline Pt has started his HEP (07/25/2020)    Time 3    Period Weeks    Status New    Target Date 08/17/20               PT Long Term Goals - 07/25/20 1334       PT LONG TERM GOAL #1   Title Patient will have a decrease in low back pain to 5/10 or less at worst to promote ability to perform tasks which involve bending over such as picking up items from the floor more comfortably.    Baseline 10/10 low back pain at most about 1 month ago (07/25/2020)    Time 8    Period Weeks    Status New    Target Date 09/21/20      PT LONG TERM GOAL #2   Title Patient will improve his FOTO score by at least 10 points as a demonstration of improved function.    Baseline FOTO lumbar spine 66 (07/25/2020)    Time 8    Period Weeks    Status New    Target Date 09/21/20      PT LONG TERM GOAL #3   Title Patient will improve his hip extension and abduction strength by at least 1/2 MMT grade to promote ability to lift items from the floor more comfortably for his back.    Baseline Hip extension 3+/5 R, 4/5 L, hip abduction 4/5 R, 4/5 L (07/25/2020)    Time 8    Period Weeks    Status New    Target Date 09/21/20                   Plan - 08/03/20 0855     Clinical Impression Statement Pt demonstrates significant core weakness with max cues to activate transversus abdominis muscle. Continued working on extension and R side bending exercises secondary to directional preference. Pt tolerated session well without aggravation of symptoms. Pt will benefit from skilled physical therapy services to decrease pain, improve strength and function.    Personal Factors and Comorbidities Age;Comorbidity 3+;Fitness;Past/Current Experience    Comorbidities HTN, fronto-temporal dementia, nephrolithiasis, mild cerebral atrophy    Examination-Activity Limitations Bend;Lift    Stability/Clinical Decision Making Stable/Uncomplicated    Clinical Decision Making Low    Rehab Potential Fair     PT Frequency 2x / week    PT Duration 8 weeks    PT Treatment/Interventions Therapeutic activities;Therapeutic exercise;Neuromuscular re-education;Patient/family education;Manual techniques;Dry needling;Spinal Manipulations;Joint Manipulations;Electrical Stimulation;Iontophoresis 4mg /ml Dexamethasone;Traction  PT Next Visit Plan posture, trunk, and hip strengthening, lumbopelvic control, manual techniques, modlities PRN    Consulted and Agree with Plan of Care Patient             Patient will benefit from skilled therapeutic intervention in order to improve the following deficits and impairments:  Pain, Postural dysfunction, Improper body mechanics, Decreased strength  Visit Diagnosis: Chronic right-sided low back pain, unspecified whether sciatica present     Problem List Patient Active Problem List   Diagnosis Date Noted   Cerebral atrophy, mild (HCC) 12/14/2018   OSA (obstructive sleep apnea) 05/20/2018   Benign essential hypertension 06/05/2015   Hyperlipidemia, mixed 06/05/2015   Nephrolithiasis 05/20/2013    Loralyn Freshwater PT, DPT   08/03/2020, 10:55 AM  [place] [facility] [address], [place], [address] Phone: [phone]   Fax:  [phone]  Name: Chad Stone MRN: [id number] Date of Birth: [birth year]

## 2020-08-08 ENCOUNTER — Ambulatory Visit: Payer: Medicaid Other

## 2020-08-10 ENCOUNTER — Ambulatory Visit: Payer: Medicaid Other

## 2020-08-15 ENCOUNTER — Ambulatory Visit: Payer: Medicaid Other

## 2020-08-17 ENCOUNTER — Ambulatory Visit: Payer: Medicaid Other

## 2020-08-22 ENCOUNTER — Ambulatory Visit: Payer: Medicaid Other

## 2020-08-24 ENCOUNTER — Ambulatory Visit
Admission: EM | Admit: 2020-08-24 | Discharge: 2020-08-24 | Disposition: A | Discharge status: Home / Self Care | Payer: Medicaid Other | Attending: Emergency Medicine | Admitting: Emergency Medicine

## 2020-08-24 ENCOUNTER — Encounter: Payer: Self-pay | Admitting: Emergency Medicine

## 2020-08-24 ENCOUNTER — Other Ambulatory Visit: Payer: Self-pay

## 2020-08-24 DIAGNOSIS — Z8616 Personal history of COVID-19: Secondary | ICD-10-CM | POA: Diagnosis not present

## 2020-08-24 DIAGNOSIS — R0602 Shortness of breath: Secondary | ICD-10-CM

## 2020-08-24 MED ORDER — ALBUTEROL SULFATE HFA 108 (90 BASE) MCG/ACT IN AERS: 1.0000 | INHALATION_SPRAY | Freq: Four times a day (QID) | RESPIRATORY_TRACT | 0 refills | Status: DC | PRN

## 2020-08-24 NOTE — ED Provider Notes (Signed)
CHIEF COMPLAINT:   Chief Complaint  Patient presents with   Shortness of Breath     SUBJECTIVE/HPI:   Shortness of Breath A very pleasant 50 y.o.Male presents today with shortness of breath post COVID.  Patient reports that he tested positive for COVID-19 on 08/05/2020.  Patient reports feeling as if he "cannot get air in". Patient does not report any chest pain, palpitations, visual changes, weakness, tingling, headache, nausea, vomiting, diarrhea, fever, chills.   has a past medical history of Benign essential HTN, Cerebral atrophy, mild (HCC), Fronto-temporal dementia (HCC), Hyperlipidemia, Nephrolithiasis, and OSA (obstructive sleep apnea).  ROS:  Review of Systems  Respiratory:  Positive for shortness of breath.   See Subjective/HPI Medications, Allergies and Problem List personally reviewed in Epic today OBJECTIVE:   Vitals:   08/24/20 1008  BP: 117/84  Pulse: 65  Resp: 18  Temp: 97.6 F (36.4 C)  SpO2: 98%    Physical Exam   General: Appears well-developed and well-nourished. No acute distress.  HEENT Head: Normocephalic and atraumatic.   Ears: Hearing grossly intact, no drainage or visible deformity.  Nose: No nasal deviation.   Mouth/Throat: No stridor or tracheal deviation.  Non erythematous posterior pharynx noted with clear drainage present.  No white patchy exudate noted.  Patent airway. Eyes: Conjunctivae and EOM are normal. No eye drainage or scleral icterus bilaterally.  Neck: Normal range of motion, neck is supple.  Cardiovascular: Normal rate. Regular rhythm; no murmurs, gallops, or rubs.  Pulm/Chest: No respiratory distress. Breath sounds normal bilaterally without wheezes, rhonchi, or rales.  Neurological: Alert and oriented to person, place, and time.  Skin: Skin is warm and dry.  No rashes, lesions, abrasions or bruising noted to skin.   Psychiatric: Normal mood, affect, behavior, and thought content.   Vital signs and nursing note reviewed.    Patient stable and cooperative with examination. PROCEDURES:    LABS/X-RAYS/EKG/MEDS:   No results found for any visits on 08/24/20.  MEDICAL DECISION MAKING:   Patient presents with shortness of breath post COVID.  Patient reports that he tested positive for COVID-19 on 08/05/2020.  Patient reports feeling as if he "cannot get air in". Patient does not report any chest pain, palpitations, visual changes, weakness, tingling, headache, nausea, vomiting, diarrhea, fever, chills.  Chart review completed.  Given symptoms along with assessment findings, likely mild shortness of breath status post COVID-19.  The patient's vital signs are stable in clinic today and he is not in any respiratory distress.  Patient's oxygen saturation is 98%.  Patient appears well.  Patient is not tripoding in clinic today.  Rx an albuterol inhaler to his preferred pharmacy and advised to use as directed/prescribed.  Advised to return to clinic or go to the emergency department with any worsening of symptoms to include worsening shortness of breath, fever, chest pain and dizziness.  Return as needed.  Patient verbalized understanding and agreed with treatment plan.  Patient stable upon discharge. ASSESSMENT/PLAN:  1. Shortness of breath - albuterol (VENTOLIN HFA) 108 (90 Base) MCG/ACT inhaler; Inhale 1-2 puffs into the lungs every 6 (six) hours as needed (cough).  Dispense: 6.7 g; Refill: 0  2. History of COVID-19 Instructions about new medications and side effects provided.  Plan:   Discharge Instructions      Use albuterol inhaler as prescribed.  Return to clinic or go to the emergency department with any worsening of symptoms to include worsening shortness of breath, fever, chest pain, dizziness.  Amalia Greenhouse, FNP 08/24/20 1032

## 2020-08-24 NOTE — Discharge Instructions (Addendum)
Use albuterol inhaler as prescribed.  Return to clinic or go to the emergency department with any worsening of symptoms to include worsening shortness of breath, fever, chest pain, dizziness.

## 2020-08-24 NOTE — ED Triage Notes (Signed)
Pt presents today with c/o of SOB stating "I just can't get the air in". Denies CP, n/v/d. Was positive for Covid on 08/05/20.

## 2020-08-29 ENCOUNTER — Ambulatory Visit: Payer: Medicaid Other | Attending: Physical Medicine and Rehabilitation

## 2020-08-29 DIAGNOSIS — G8929 Other chronic pain: Secondary | ICD-10-CM | POA: Insufficient documentation

## 2020-08-29 DIAGNOSIS — M545 Low back pain, unspecified: Secondary | ICD-10-CM | POA: Insufficient documentation

## 2020-08-31 ENCOUNTER — Ambulatory Visit: Payer: Medicaid Other

## 2020-09-11 ENCOUNTER — Ambulatory Visit: Payer: Medicaid Other

## 2020-09-11 DIAGNOSIS — M545 Low back pain, unspecified: Secondary | ICD-10-CM

## 2020-09-11 DIAGNOSIS — G8929 Other chronic pain: Secondary | ICD-10-CM | POA: Diagnosis present

## 2020-09-11 NOTE — Patient Instructions (Signed)
Access Code: [email or web address] URL: [email or web address] Date: 09/11/2020 Prepared by: Loralyn Freshwater  Exercises Standing Lumbar Extension - 1 x daily - 7 x weekly - 3 sets - 10 reps - 5 seconds hold Standing Sidebends - 1 x daily - 7 x weekly - 3 sets - 10 reps - 5 seconds hold Prone Quadriceps Set - 1 x daily - 7 x weekly - 3 sets - 10 reps - 5 seconds hold

## 2020-09-11 NOTE — Therapy (Signed)
[place] [facility] [address], [place], [address] Phone: [phone]   Fax:  [phone]  Physical Therapy Treatment  Patient Details  Name: Chad Stone MRN: [id number] Date of Birth: 01/06/1971 Referring Provider (PT): Chad Apple, MD   Encounter Date: 09/11/2020   PT End of Session - 09/11/20 1150     Visit Number 4    Number of Visits 17    Date for PT Re-Evaluation 09/21/20    Authorization Type 1    Authorization Time Period 12 until 09/20/2020    PT Start Time 1150    PT Stop Time 1220    PT Time Calculation (min) 30 min    Activity Tolerance Patient tolerated treatment well    Behavior During Therapy [facility] for tasks assessed/performed             Past Medical History:  Diagnosis Date   Benign essential HTN    Cerebral atrophy, mild (HCC)    Fronto-temporal dementia ([place])    Hyperlipidemia    Nephrolithiasis    OSA (obstructive sleep apnea)     Past Surgical History:  Procedure Laterality Date   LITHOTRIPSY      There were no vitals filed for this visit.   Subjective Assessment - 09/11/20 1152     Subjective Feeling better from the COVID. Back is good, no pain currently. 0/10 back pain at most for the past 7 days. Has been doing his HEP 3 sets a day. R hamstrings still cramp.    Pertinent History Low back pain. Pain began about a couple of months ago, mainly on the R low back. Had a couple of shots in low back last Thursday which seems to be helping. Denies LE paresthesia, or loss of bowel or bladder control. Has had the same back pain about 2 years ago. Has not yet had PT.    Patient Stated Goals Decrease pain. Be able to pick up stuff.    Currently in Pain? No/denies    Pain Score 0-No pain    Pain Onset More than a month ago                                       PT Education - 09/11/20 1246     Education Details ther-ex    Person(s) Educated  Patient    Methods Explanation;Demonstration;Tactile cues;Verbal cues    Comprehension Returned demonstration;Verbalized understanding           Objective   .    No latex allergies Blood pressure is controlled per pt     Manual L lateral shift with PT: increased R Low back symptoms   Manual R lateral shift (L lateral shift correction): no symptoms.     Medbridge Access Code [email or web address]     Therapeutic exercise   Seated manually resisted R lateral shift isometrics, PT manual resistance to correct posture 10x5 seconds    Seated manually resisted R upper trunk rotation isometrics to promote L lower trunk rotation and more neutral lumbar posture 10x3 with 5 second holds   Prone manually resisted hip extension, S/L hip abduction 1x each way for each LE  Hip extension 4-/5 R, 4/5 L, hip abduction 4+/5 R, 5/5 L.   Prone glute max extension   R 1x. R hamstring cramp, eases with rest  Prone glute max set   R  10x5 seconds for 2 sets  L 10x5 seconds for 2 sets.   S/L hip abduction   R 10x2. Difficulty with 2nd set.   Rest breaks secondary to fatigue.    Improved exercise technique, movement at target joints, use of target muscles after mod verbal, visual, tactile cues.      Response to treatment Pt tolerated session well without aggravation of symptoms.        Clinical impression Pt demonstrates improved low back and hip strength as well as function (improved FOTO score) since initial evaluation. Pt making very good progress with PT toward goals. Continued working on trunk, and and glute strength as well as posture to decrease stress to affected areas in his back. Pt tolerated session well without aggravation of symptoms. Session ended early secondary to not wanting to overwork pt after recovering from [place]. Pt will benefit from skilled physical therapy services to decrease pain, improve strength and function.       PT Short Term Goals - 09/11/20 1153       PT  SHORT TERM GOAL #1   Title Pt will be independent with his initial HEP to decrease pain, improve strength and function.    Baseline Pt has started his HEP (07/25/2020); performing his HEP, no questions so far. (09/11/2020)    Time 3    Period Weeks    Status Achieved    Target Date 08/17/20               PT Long Term Goals - 09/11/20 1154       PT LONG TERM GOAL #1   Title Patient will have a decrease in low back pain to 5/10 or less at worst to promote ability to perform tasks which involve bending over such as picking up items from the floor more comfortably.    Baseline 10/10 low back pain at most about 1 month ago (07/25/2020); 0/10 at most for the past 7 days (09/11/2020)    Time 8    Period Weeks    Status Achieved    Target Date 09/21/20      PT LONG TERM GOAL #2   Title Patient will improve his FOTO score by at least 10 points as a demonstration of improved function.    Baseline FOTO lumbar spine 66 (07/25/2020); 94 (09/11/2020)    Time 8    Period Weeks    Status Achieved    Target Date 09/21/20      PT LONG TERM GOAL #3   Title Patient will improve his hip extension and abduction strength by at least 1/2 MMT grade to promote ability to lift items from the floor more comfortably for his back.    Baseline Hip extension 3+/5 R, 4/5 L, hip abduction 4/5 R, 4/5 L (07/25/2020);Hip extension 4-/5 R, 4/5 L, hip abduction 4+/5 R, 5/5 L. (09/11/2020)    Time 8    Period Weeks    Status Partially Met    Target Date 09/21/20                   Plan - 09/11/20 1209     Clinical Impression Statement Pt demonstrates improved low back and hip strength as well as function (improved FOTO score) since initial evaluation. Pt making very good progress with PT toward goals. Continued working on trunk, and and glute strength as well as posture to decrease stress to affected areas in his back. Pt tolerated session well without aggravation of symptoms. Session  ended early secondary to  not wanting to overwork pt after recovering from [place]. Pt will benefit from skilled physical therapy services to decrease pain, improve strength and function.    Personal Factors and Comorbidities Age;Comorbidity 3+;Fitness;Past/Current Experience    Comorbidities HTN, fronto-temporal dementia, nephrolithiasis, mild cerebral atrophy    Examination-Activity Limitations Bend;Lift    Stability/Clinical Decision Making Stable/Uncomplicated    Clinical Decision Making Low    Rehab Potential Fair    PT Frequency 2x / week    PT Duration 8 weeks    PT Treatment/Interventions Therapeutic activities;Therapeutic exercise;Neuromuscular re-education;Patient/family education;Manual techniques;Dry needling;Spinal Manipulations;Joint Manipulations;Electrical Stimulation;Iontophoresis 27m/ml Dexamethasone;Traction    PT Next Visit Plan posture, trunk, and hip strengthening, lumbopelvic control, manual techniques, modlities PRN    Consulted and Agree with Plan of Care Patient             Patient will benefit from skilled therapeutic intervention in order to improve the following deficits and impairments:  Pain, Postural dysfunction, Improper body mechanics, Decreased strength  Visit Diagnosis: Chronic right-sided low back pain, unspecified whether sciatica present     Problem List Patient Active Problem List   Diagnosis Date Noted   [place] 12/14/2018   OSA (obstructive sleep apnea) 05/20/2018   Benign essential hypertension 06/05/2015   Hyperlipidemia, mixed 06/05/2015   Nephrolithiasis 05/20/2013    MJoneen BoersPT, DPT   09/11/2020, 12:47 PM  [facility] [address] [place] [address]Phone: [phone]  Fax:  [phone] Name: Chad BAUMGARDNERMRN: [id number] of Birth: 1Mar 20, 1972

## 2020-09-13 ENCOUNTER — Ambulatory Visit: Payer: Medicaid Other

## 2020-09-13 DIAGNOSIS — M545 Low back pain, unspecified: Secondary | ICD-10-CM | POA: Diagnosis not present

## 2020-09-13 DIAGNOSIS — G8929 Other chronic pain: Secondary | ICD-10-CM

## 2020-09-13 NOTE — Therapy (Signed)
[facility] [address] [place], [address] Phone: [phone]   Fax:  [phone]  Physical Therapy Treatment  Patient Details  Name: Chad Stone MRN: [id number] Date of Birth: 11/06/1970 Referring Provider (PT): Laroy Apple, MD   Encounter Date: 09/13/2020   PT End of Session - 09/13/20 1156     Visit Number 5    Number of Visits 17    Date for PT Re-Evaluation 09/21/20    Authorization Type [place] Medicaid    Authorization Time Period Medicaid Auth 4/59-9/7 (12 visits) ; cert 7/41-4/2    Authorization - Visit Number 4    Authorization - Number of Visits 12    Progress Note Due on Visit 10    PT Start Time 3953    PT Stop Time 1225    PT Time Calculation (min) 40 min    Activity Tolerance Patient tolerated treatment well;No increased pain    Behavior During Therapy WFL for tasks assessed/performed             Past Medical History:  Diagnosis Date   Benign essential HTN    Cerebral atrophy, mild (HCC)    Fronto-temporal dementia (HCC)    Hyperlipidemia    Nephrolithiasis    OSA (obstructive sleep apnea)     Past Surgical History:  Procedure Laterality Date   LITHOTRIPSY      There were no vitals filed for this visit.   Subjective Assessment - 09/13/20 1151     Subjective Pt doing well today, no updates since prior visit. HEP going well in general.    Pertinent History Low back pain. Pain began about a couple of months ago, mainly on the R low back. Had a couple of shots in low back last Thursday which seems to be helping. Denies LE paresthesia, or loss of bowel or bladder control. Has had the same back pain about 2 years ago. Has not yet had PT.    Patient Stated Goals Decrease pain. Be able to pick up stuff.    Currently in Pain? No/denies             INTERVENTION THIS DATE: -Seated pallof press 1x15 c GreenTB promoting isometric Rt trunk rotation -STS from  chair+airex hands on knees 1x10  -Seated pallof press 1x15 c GreenTB promoting isometric Rt trunk rotation -STS from chair+airex hands on knees 1x10  -standing side step c RedTB resistance 1x15 bilat -standing hip flexion 1x15 bilat   -standing side step c Blue TB resistance 1x15 bilat -standing hip flexion 1x15 bilat   *Extensive cues required.  Tending to exercise form and technique, with evidence of short-term memory deficits, and difficulty attending to the task of counting.  This is after his first encounter with this patient, unclear if his cognitive limitations are consistent with prior episodes.       PT Short Term Goals - 09/11/20 1153       PT SHORT TERM GOAL #1   Title Pt will be independent with his initial HEP to decrease pain, improve strength and function.    Baseline Pt has started his HEP (07/25/2020); performing his HEP, no questions so far. (09/11/2020)    Time 3    Period Weeks    Status Achieved    Target Date 08/17/20               PT Long Term Goals - 09/11/20 1154       PT  LONG TERM GOAL #1   Title Patient will have a decrease in low back pain to 5/10 or less at worst to promote ability to perform tasks which involve bending over such as picking up items from the floor more comfortably.    Baseline 10/10 low back pain at most about 1 month ago (07/25/2020); 0/10 at most for the past 7 days (09/11/2020)    Time 8    Period Weeks    Status Achieved    Target Date 09/21/20      PT LONG TERM GOAL #2   Title Patient will improve his FOTO score by at least 10 points as a demonstration of improved function.    Baseline FOTO lumbar spine 66 (07/25/2020); 94 (09/11/2020)    Time 8    Period Weeks    Status Achieved    Target Date 09/21/20      PT LONG TERM GOAL #3   Title Patient will improve his hip extension and abduction strength by at least 1/2 MMT grade to promote ability to lift items from the floor more comfortably for his back.    Baseline Hip  extension 3+/5 R, 4/5 L, hip abduction 4/5 R, 4/5 L (07/25/2020);Hip extension 4-/5 R, 4/5 L, hip abduction 4+/5 R, 5/5 L. (09/11/2020)    Time 8    Period Weeks    Status Partially Met    Target Date 09/21/20                   Plan - 09/13/20 1159     Clinical Impression Statement Continue with plan of care today to address deficits in hip and core strength.  Goal continues to be to maximize medical control of pelvis and hips and trunk when standing and walking.  Patient tolerated session well in general with no pain, able to advance to standing and mildly resisted activities.  Patient encouraged to continue to advance walking activity at home to recuperate cardiorespiratory deficits remaining from COVID infection earlier in the month.  Assessment session ended early due to noted urinary incontinence, concern for bodily fluid contamination in the clinic setting, and to provide patient an opportunity for hygiene.  Patient continues to make excellent progress toward goals of treatment overall but does continue to exhibit noted weakness and motor control deficits.    Personal Factors and Comorbidities Age;Comorbidity 3+;Fitness;Past/Current Experience    Comorbidities HTN, fronto-temporal dementia, nephrolithiasis, mild cerebral atrophy    Examination-Activity Limitations Bend;Lift    Stability/Clinical Decision Making Stable/Uncomplicated    Clinical Decision Making Low    Rehab Potential Fair    PT Frequency 2x / week    PT Duration 8 weeks    PT Treatment/Interventions Therapeutic activities;Therapeutic exercise;Neuromuscular re-education;Patient/family education;Manual techniques;Dry needling;Spinal Manipulations;Joint Manipulations;Electrical Stimulation;Iontophoresis 36m/ml Dexamethasone;Traction    PT Next Visit Plan posture, trunk, and hip strengthening, lumbopelvic control, manual techniques, modlities PRN    PT Home Exercise Plan No updates this date.    Consulted and Agree with  Plan of Care Patient             Patient will benefit from skilled therapeutic intervention in order to improve the following deficits and impairments:  Pain, Postural dysfunction, Improper body mechanics, Decreased strength  Visit Diagnosis: Chronic right-sided low back pain, unspecified whether sciatica present     Problem List Patient Active Problem List   Diagnosis Date Noted   [place]bral [place], mild [place] 12/14/2018   OSA (obstructive sleep apnea) 05/20/2018   Benign essential hypertension  06/05/2015   Hyperlipidemia, mixed 06/05/2015   Nephrolithiasis 05/20/2013   12:23 PM, 09/13/20 Etta Grandchild, PT, DPT Physical Therapist - [facility] [phone] (Office)   Brittany Amirault C 09/13/2020, 12:01 PM  [facility] [address], [place], [address] Phone: [phone]   Fax:  [phone]  Name: Chad Stone MRN: [id number] Date of Birth: 1970-07-24

## 2020-09-20 ENCOUNTER — Ambulatory Visit: Payer: Medicaid Other | Attending: Physical Medicine and Rehabilitation

## 2020-09-20 DIAGNOSIS — M545 Low back pain, unspecified: Secondary | ICD-10-CM | POA: Insufficient documentation

## 2020-09-20 DIAGNOSIS — G8929 Other chronic pain: Secondary | ICD-10-CM | POA: Insufficient documentation

## 2020-09-20 NOTE — Therapy (Signed)
[facility] [address] [place], [address] Phone: [phone]   Fax:  [phone]  Physical Therapy Treatment And Discharge Summary  Patient Details  Name: Chad Stone MRN: [id number] Date of Birth: [birth year] Referring Provider (PT): Romero Belling, MD   Encounter Date: 09/20/2020   PT End of Session - 09/20/20 0935     Visit Number 6    Number of Visits 17    Date for PT Re-Evaluation 09/21/20    Authorization Type  Medicaid    Authorization Time Period Medicaid Auth 7/28-9/7 (12 visits) ; cert 8/92-1/1    Authorization - Visit Number 4    Authorization - Number of Visits 12    Progress Note Due on Visit 10    PT Start Time 0935    PT Stop Time 1002    PT Time Calculation (min) 27 min    Activity Tolerance Patient tolerated treatment well;No increased pain    Behavior During Therapy WFL for tasks assessed/performed             Past Medical History:  Diagnosis Date   Benign essential HTN    Cerebral atrophy, mild (HCC)    Fronto-temporal dementia (HCC)    Hyperlipidemia    Nephrolithiasis    OSA (obstructive sleep apnea)     Past Surgical History:  Procedure Laterality Date   LITHOTRIPSY      There were no vitals filed for this visit.   Subjective Assessment - 09/20/20 0936     Subjective Back is alright.  No pain currently. 0/10 at most for the past 7 days. The HEP is good. Feels like he can continue his progress with his HEP.    Pertinent History Low back pain. Pain began about a couple of months ago, mainly on the R low back. Had a couple of shots in low back last Thursday which seems to be helping. Denies LE paresthesia, or loss of bowel or bladder control. Has had the same back pain about 2 years ago. Has not yet had PT.    Patient Stated Goals Decrease pain. Be able to pick up stuff.    Currently in Pain? No/denies    Pain Score 0-No pain                                      Objective   .    No latex allergies Blood pressure is controlled per pt     Manual L lateral shift with PT: increased R Low back symptoms   Manual R lateral shift (L lateral shift correction): no symptoms.       Medbridge Access Code [email or web address]     Therapeutic exercise   Prone manually resisted hip extension, S/L hip abduction 1x each way for each LE             Hip extension 4+/5 R, 4+/5 L, hip abduction 5/5 R, 5/5 L  Hooklying transversus abdominis contraction 10x3 with 5 second holds   Seated B scapular retraction 10x5 seconds for 3 sets  Seated manually resisted R lateral shift isometrics, PT manual resistance to correct posture 10x5 seconds for 3 sets     Seated manually resisted R upper trunk rotation isometrics to promote L lower trunk rotation and more neutral lumbar posture 10x3 with 5 second holds    Improved exercise technique, movement at target  joints, use of target muscles after mod verbal, visual, tactile cues.      Response to treatment Pt tolerated session well without aggravation of symptoms.        Clinical impression Pt demonstrates improved B hip strength, significant improvement and significant decrease in pain since initial evaluation. Pt has achieved all goals and demonstrates consistency and independence with his home exercise program. Skilled physical therapy services discharged with pt continuing progress with his exercises at home.        PT Education - 09/20/20 0939     Education Details ther-ex    Person(s) Educated Patient    Methods Explanation;Demonstration;Tactile cues;Verbal cues    Comprehension Returned demonstration;Verbalized understanding              PT Short Term Goals - 09/11/20 1153       PT SHORT TERM GOAL #1   Title Pt will be independent with his initial HEP to decrease pain, improve strength and function.    Baseline Pt has started his HEP (07/25/2020);  performing his HEP, no questions so far. (09/11/2020)    Time 3    Period Weeks    Status Achieved    Target Date 08/17/20               PT Long Term Goals - 09/20/20 0942       PT LONG TERM GOAL #1   Title Patient will have a decrease in low back pain to 5/10 or less at worst to promote ability to perform tasks which involve bending over such as picking up items from the floor more comfortably.    Baseline 10/10 low back pain at most about 1 month ago (07/25/2020); 0/10 at most for the past 7 days (09/11/2020), (09/20/2020)    Time 8    Period Weeks    Status Achieved      PT LONG TERM GOAL #2   Title Patient will improve his FOTO score by at least 10 points as a demonstration of improved function.    Baseline FOTO lumbar spine 66 (07/25/2020); 94 (09/11/2020)    Time 8    Period Weeks    Status Achieved      PT LONG TERM GOAL #3   Title Patient will improve his hip extension and abduction strength by at least 1/2 MMT grade to promote ability to lift items from the floor more comfortably for his back.    Baseline Hip extension 3+/5 R, 4/5 L, hip abduction 4/5 R, 4/5 L (07/25/2020);Hip extension 4-/5 R, 4/5 L, hip abduction 4+/5 R, 5/5 L. (09/11/2020);Hip extension 4+/5 R, 4+/5 L, hip abduction 5/5 R, 5/5 L (09/20/2020)    Time 8    Period Weeks    Status Achieved    Target Date 09/21/20                   Plan - 09/20/20 0934     Clinical Impression Statement Pt demonstrates improved B hip strength, significant improvement and significant decrease in pain since initial evaluation. Pt has achieved all goals and demonstrates consistency and independence with his home exercise program. Skilled physical therapy services discharged with pt continuing progress with his exercises at home.    Personal Factors and Comorbidities Comorbidity 3+    Comorbidities HTN, fronto-temporal dementia, nephrolithiasis, mild cerebral atrophy    Examination-Activity Limitations --     Stability/Clinical Decision Making Stable/Uncomplicated    Clinical Decision Making Low    Rehab Potential --  PT Frequency --    PT Duration --    PT Treatment/Interventions Therapeutic activities;Therapeutic exercise;Neuromuscular re-education;Patient/family education;Manual techniques    PT Next Visit Plan Coninue progress with HEP    PT Home Exercise Plan Medbridge Access Code [email or web address]    Consulted and Agree with Plan of Care Patient             Patient will benefit from skilled therapeutic intervention in order to improve the following deficits and impairments:     Visit Diagnosis: Chronic right-sided low back pain, unspecified whether sciatica present     Problem List Patient Active Problem List   Diagnosis Date Noted   Cerebral atrophy, mild (HCC) 12/14/2018   OSA (obstructive sleep apnea) 05/20/2018   Benign essential hypertension 06/05/2015   Hyperlipidemia, mixed 06/05/2015   Nephrolithiasis 05/20/2013    Thank you for your referral.  Loralyn Freshwater PT, DPT   09/20/2020, 10:22 AM  [facility] [address] [place], [address] Phone: [phone]   Fax:  [phone]  Name: Chad Stone MRN: [id number] Date of Birth: November 09, 1970

## 2020-10-04 ENCOUNTER — Ambulatory Visit
Admission: RE | Admit: 2020-10-04 | Discharge: 2020-10-04 | Disposition: A | Discharge status: Home / Self Care | Payer: Medicaid Other | Attending: Internal Medicine | Admitting: Internal Medicine

## 2020-10-04 ENCOUNTER — Ambulatory Visit
Admission: RE | Admit: 2020-10-04 | Discharge: 2020-10-04 | Disposition: A | Discharge status: Home / Self Care | Payer: Medicaid Other | Source: Ambulatory Visit | Attending: Internal Medicine | Admitting: Internal Medicine

## 2020-10-04 ENCOUNTER — Other Ambulatory Visit: Payer: Self-pay

## 2020-10-04 ENCOUNTER — Encounter: Payer: Self-pay | Admitting: Internal Medicine

## 2020-10-04 ENCOUNTER — Ambulatory Visit (INDEPENDENT_AMBULATORY_CARE_PROVIDER_SITE_OTHER): Payer: Medicaid Other | Admitting: Internal Medicine

## 2020-10-04 VITALS — BP 112/80 | HR 73 | Temp 97.6°F | Ht 72.0 in | Wt 222.0 lb

## 2020-10-04 DIAGNOSIS — R0602 Shortness of breath: Secondary | ICD-10-CM

## 2020-10-04 DIAGNOSIS — U071 COVID-19: Secondary | ICD-10-CM | POA: Insufficient documentation

## 2020-10-04 MED ORDER — ALBUTEROL SULFATE HFA 108 (90 BASE) MCG/ACT IN AERS: 1.0000 | INHALATION_SPRAY | Freq: Four times a day (QID) | RESPIRATORY_TRACT | 10 refills | Status: DC | PRN

## 2020-10-04 NOTE — Progress Notes (Signed)
[facility] Chad Stone        Admission                  Current  LINUS WECKERLY is a 50 y.o. old male seen in [facility] for SOB      CHIEF COMPLAINT:   Shortness of breath   HISTORY OF PRESENT ILLNESS   50 year old presents today with recent diagnosis of COVID 19 infection back in July 2022 Is here for Assessment for shortness of breath and dyspnea exertion since diagnosis  No exacerbation at this time No evidence of heart failure at this time No evidence or signs of infection at this time No respiratory distress No fevers, chills, nausea, vomiting, diarrhea No evidence of lower extremity edema No evidence hemoptysis  Non-smoker Previous secondhand smoke exposure increase shortness of breath and work of breathing mostly with exertion  Patient was prescribed prednisone and antibiotics for his COVID-19 infection  I have explained to him that patient will need chest x-ray and pulmonary function testing for further evaluation   Patient will  need 6-minute walk test to assess for exertional hypoxia   PAST MEDICAL HISTORY   Past Medical History:  Diagnosis Date   Benign essential HTN    Cerebral atrophy, mild (HCC)    Fronto-temporal dementia (HCC)    Hyperlipidemia    Nephrolithiasis    OSA (obstructive sleep apnea)      SURGICAL HISTORY   Past Surgical History:  Procedure Laterality Date   LITHOTRIPSY       FAMILY HISTORY   Family History  Problem Relation Age of Onset   Arrhythmia Father        cardiac ablation     SOCIAL HISTORY   Social History   Tobacco Use   Smoking status: Never   Smokeless tobacco: Never  Vaping Use   Vaping Use: Never used  Substance Use Topics   Alcohol use: Not Currently   Drug use: Never     MEDICATIONS    Home Medication:  Current Outpatient Rx   Order #: [id number] Class: Normal   Order #: [id number] Class: Historical Med   Order #: [id number] Class: Historical Med    Order #: [id number] Class: Historical Med   Order #: [id number] Class: Historical Med   Order #: [id number] Class: Historical Med   Order #: [id number] Class: Historical Med   Order #: [id number] Class: Historical Med   Order #: [id number] Class: Historical Med   Order #: [id number] Class: Normal   Order #: [id number] Class: Historical Med   Order #: [id number] Class: Historical Med    Current Medication:  Current Outpatient Medications:    albuterol (VENTOLIN HFA) 108 (90 Base) MCG/ACT inhaler, Inhale 1-2 puffs into the lungs every 6 (six) hours as needed (cough)., Disp: 6.7 g, Rfl: 0   amphetamine-dextroamphetamine (ADDERALL) 10 MG tablet, Take 10 mg by mouth daily with breakfast. , Disp: , Rfl:    azelastine (ASTELIN) 0.1 % nasal spray, Place 1 spray into both nostrils 2 (two) times daily. , Disp: , Rfl:    escitalopram (LEXAPRO) 10 MG tablet, Take 10 mg by mouth daily., Disp: , Rfl:    etodolac (LODINE) 500 MG tablet, Take 500 mg by mouth 2 (two) times daily., Disp: , Rfl:    latanoprost (XALATAN) 0.005 % ophthalmic solution, Place 1 drop into both eyes at bedtime. , Disp: , Rfl:    Naproxen Sodium (ALEVE PO), Take 500 mg elemental calcium/kg/hr by mouth daily as needed., Disp: , Rfl:    olmesartan-hydrochlorothiazide (BENICAR HCT)  20-12.5 MG tablet, Take 1 tablet by mouth daily., Disp: , Rfl:    predniSONE (DELTASONE) 20 MG tablet, Take 20 mg by mouth daily., Disp: , Rfl:    rosuvastatin (CRESTOR) 10 MG tablet, Take 1 tablet (10 mg total) by mouth daily., Disp: 90 tablet, Rfl: 3   sildenafil (REVATIO) 20 MG tablet, Take 20 mg by mouth 3 (three) times daily as needed. , Disp: , Rfl:    venlafaxine XR (EFFEXOR-XR) 75 MG 24 hr capsule, Take 75 mg by mouth daily with breakfast. , Disp: , Rfl:     ALLERGIES   Patient has no known allergies.     REVIEW OF SYSTEMS    Review of Systems:  Gen:  Denies  fever, sweats, chills weight loss  HEENT: Denies blurred vision, double vision, ear pain, eye  pain, hearing loss, nose bleeds, sore throat Cardiac:  No dizziness, chest pain or heaviness, chest tightness,edema, No JVD Resp:   No cough, -sputum production, -shortness of breath,-wheezing, -hemoptysis,  Gi: Denies swallowing difficulty, stomach pain, nausea or vomiting, diarrhea, constipation, bowel incontinence Gu:  Denies bladder incontinence, burning urine Ext:   Denies Joint pain, stiffness or swelling Skin: Denies  skin rash, easy bruising or bleeding or hives Endoc:  Denies polyuria, polydipsia , polyphagia or weight change Psych:   Denies depression, insomnia or hallucinations  Other:  All other systems negative  BP 112/80 (BP Location: Left Arm, Patient Position: Sitting, Cuff Size: Normal)   Pulse 73   Temp 97.6 F (36.4 C) (Oral)   Ht 6' (1.829 m)   Wt 222 lb (100.7 kg)   SpO2 99%   BMI 30.11 kg/m     Physical Examination:   General Appearance: No distress  EYES PERRLA, EOM intact.   NECK Supple, No JVD Pulmonary: normal breath sounds, No wheezing.  CardiovascularNormal S1,S2.  No m/r/g.   Abdomen: Benign, Soft, non-tender. Skin:   warm, no rashes, no ecchymosis  Extremities: normal, no cyanosis, clubbing. Neuro:without focal findings,  speech normal  PSYCHIATRIC: Mood, affect within normal limits.   ALL OTHER ROS ARE NEGATIVE      ASSESSMENT/PLAN   50 year old pleasant white male seen today for assessment of shortness of breath and dyspnea exertion related to post COVID infection several months ago  At this time, I have explained to him that patient will need chest x-ray and pulmonary function testing for further evaluation Patient will  need 6-minute walk test to assess for exertional hypoxia  Albuterol MDI helps his symptoms significantly Will refill these meds   MEDICATION ADJUSTMENTS/LABS AND TESTS ORDERED: Albuterol as needed Chest x-ray Pulm function testing 6-minute walk test   CURRENT MEDICATIONS REVIEWED AT LENGTH WITH PATIENT  TODAY   Patient  satisfied with Plan of action and management. All questions answered  Follow up 3 months  Total Time Spent 35 mins   Wallis Bamberg Santiago Glad, M.D.  [facility]  Medical Director [facility] Medical Director [facility] Cardio-Pulmonary Department

## 2020-10-04 NOTE — Patient Instructions (Addendum)
Chest x-ray to assess lungs Pulmonary function testing to assess status Check 6-minute walk test

## 2020-10-05 ENCOUNTER — Telehealth: Payer: Self-pay | Admitting: Internal Medicine

## 2020-10-05 NOTE — Telephone Encounter (Signed)
This PFT appt has been changed to 10/18/20 @ 10:00am per request. Appt information and directions mailed to sister

## 2020-10-16 ENCOUNTER — Telehealth: Payer: Self-pay

## 2020-10-16 NOTE — Telephone Encounter (Signed)
Noted.  Will close encounter.  

## 2020-10-16 NOTE — Telephone Encounter (Signed)
Called and LVM in regards to upcoming covid test, left a call back number. Will try again later.

## 2020-10-17 ENCOUNTER — Other Ambulatory Visit
Admission: RE | Admit: 2020-10-17 | Discharge: 2020-10-17 | Disposition: A | Discharge status: Home / Self Care | Payer: Medicaid Other | Source: Ambulatory Visit | Attending: Internal Medicine | Admitting: Internal Medicine

## 2020-10-17 ENCOUNTER — Other Ambulatory Visit: Payer: Self-pay

## 2020-10-17 DIAGNOSIS — Z20822 Contact with and (suspected) exposure to covid-19: Secondary | ICD-10-CM | POA: Insufficient documentation

## 2020-10-17 DIAGNOSIS — Z01812 Encounter for preprocedural laboratory examination: Secondary | ICD-10-CM | POA: Insufficient documentation

## 2020-10-18 ENCOUNTER — Ambulatory Visit: Payer: Medicaid Other | Attending: Internal Medicine

## 2020-10-18 ENCOUNTER — Ambulatory Visit: Payer: Medicaid Other

## 2020-10-18 DIAGNOSIS — U071 COVID-19: Secondary | ICD-10-CM

## 2020-10-18 DIAGNOSIS — R0602 Shortness of breath: Secondary | ICD-10-CM | POA: Diagnosis not present

## 2020-10-18 LAB — SARS CORONAVIRUS 2 (TAT 6-24 HRS): SARS Coronavirus 2: NEGATIVE

## 2020-10-18 MED ORDER — ALBUTEROL SULFATE (2.5 MG/3ML) 0.083% IN NEBU
2.5000 mg | INHALATION_SOLUTION | Freq: Once | RESPIRATORY_TRACT | Status: AC
  Administered 2020-10-18: 2.5 mg via RESPIRATORY_TRACT
  Filled 2020-10-18: qty 3

## 2020-10-18 NOTE — Progress Notes (Signed)
Patient was unable to do DLCO testing, charge removed.

## 2020-10-19 LAB — PULMONARY FUNCTION TEST ARMC ONLY
FEF 25-75 Post: 6.6 L/sec
FEF 25-75 Pre: 6.34 L/sec
FEF2575-%Change-Post: 4 %
FEF2575-%Pred-Post: 182 %
FEF2575-%Pred-Pre: 175 %
FEV1-%Change-Post: 6 %
FEV1-%Pred-Post: 120 %
FEV1-%Pred-Pre: 112 %
FEV1-Post: 5.01 L
FEV1-Pre: 4.68 L
FEV1FVC-%Change-Post: 1 %
FEV1FVC-%Pred-Pre: 122 %
FEV6-%Change-Post: 6 %
FEV6-%Pred-Post: 100 %
FEV6-%Pred-Pre: 94 %
FEV6-Post: 5.21 L
FEV6-Pre: 4.91 L
FEV6FVC-%Pred-Post: 103 %
FEV6FVC-%Pred-Pre: 103 %
FVC-%Change-Post: 5 %
FVC-%Pred-Post: 97 %
FVC-%Pred-Pre: 91 %
FVC-Post: 5.21 L
FVC-Pre: 4.92 L
Post FEV1/FVC ratio: 96 %
Post FEV6/FVC ratio: 100 %
Pre FEV1/FVC ratio: 95 %
Pre FEV6/FVC Ratio: 100 %
RV % pred: -56 %
RV: -1.23 L
TLC % pred: 150 %
TLC: 11.08 L

## 2020-12-20 ENCOUNTER — Other Ambulatory Visit: Payer: Self-pay

## 2020-12-20 ENCOUNTER — Ambulatory Visit (INDEPENDENT_AMBULATORY_CARE_PROVIDER_SITE_OTHER): Payer: Medicaid Other | Admitting: Internal Medicine

## 2020-12-20 ENCOUNTER — Encounter: Payer: Self-pay | Admitting: Internal Medicine

## 2020-12-20 VITALS — BP 100/70 | HR 68 | Temp 97.6°F | Ht 70.5 in | Wt 228.0 lb

## 2020-12-20 DIAGNOSIS — R0602 Shortness of breath: Secondary | ICD-10-CM

## 2020-12-20 DIAGNOSIS — U099 Post covid-19 condition, unspecified: Secondary | ICD-10-CM | POA: Diagnosis not present

## 2020-12-20 NOTE — Patient Instructions (Signed)
Albuterol as needed Avoid second hand smoke

## 2020-12-20 NOTE — Progress Notes (Signed)
[facility]monary Medicine Consultation      CHIEF COMPLAINT:   Follow-up shortness of breath Previous history and diagnosis of COVID-19 infection July 2022     HISTORY OF PRESENT ILLNESS   No exacerbation at this time No evidence of heart failure at this time No evidence or signs of infection at this time No respiratory distress No fevers, chills, nausea, vomiting, diarrhea No evidence of lower extremity edema No evidence hemoptysis  Non-smoker Secondhand smoke exposure   In July 2022 Patient was prescribed prednisone and antibiotics for his COVID-19 infection  Pulmonary function testing October 2022 Patient had a difficult time understanding and following test directions. Patient gave his best effort. Pt unable to blow out to meet criteria on FVC testing,  Spirometry Data Is Acceptable and Reproducible  No obvious evidence of Obstructive Airways disease or Restrictive Lung disease      PAST MEDICAL HISTORY   Past Medical History:  Diagnosis Date   Benign essential HTN    Cerebral atrophy, mild (HCC)    Fronto-temporal dementia (HCC)    Hyperlipidemia    Nephrolithiasis    OSA (obstructive sleep apnea)    Home Medication:  Current Outpatient Rx   Order #: [id number] Class: Normal   Order #: [id number] Class: Historical Med   Order #: [id number] Class: Historical Med   Order #: [id number] Class: Historical Med   Order #: [id number] Class: Historical Med   Order #: [id number] Class: Historical Med   Order #: [id number] Class: Historical Med   Order #: [id number] Class: Historical Med   Order #: [id number] Class: Historical Med   Order #: [id number] Class: Normal   Order #: [id number] Class: Historical Med   Order #: [id number] Class: Historical Med    Current Medication:  Current Outpatient Medications:    albuterol (VENTOLIN HFA) 108 (90 Base) MCG/ACT inhaler, Inhale 1-2 puffs into the lungs every 6 (six) hours as needed (cough)., Disp: 6.7 g, Rfl: 10    amphetamine-dextroamphetamine (ADDERALL) 10 MG tablet, Take 10 mg by mouth daily with breakfast. , Disp: , Rfl:    azelastine (ASTELIN) 0.1 % nasal spray, Place 1 spray into both nostrils 2 (two) times daily. , Disp: , Rfl:    escitalopram (LEXAPRO) 10 MG tablet, Take 10 mg by mouth daily., Disp: , Rfl:    etodolac (LODINE) 500 MG tablet, Take 500 mg by mouth 2 (two) times daily., Disp: , Rfl:    latanoprost (XALATAN) 0.005 % ophthalmic solution, Place 1 drop into both eyes at bedtime. , Disp: , Rfl:    Naproxen Sodium (ALEVE PO), Take 500 mg elemental calcium/kg/hr by mouth daily as needed., Disp: , Rfl:    olmesartan-hydrochlorothiazide (BENICAR HCT) 20-12.5 MG tablet, Take 1 tablet by mouth daily., Disp: , Rfl:    predniSONE (DELTASONE) 20 MG tablet, Take 20 mg by mouth daily., Disp: , Rfl:    rosuvastatin (CRESTOR) 10 MG tablet, Take 1 tablet (10 mg total) by mouth daily., Disp: 90 tablet, Rfl: 3   sildenafil (REVATIO) 20 MG tablet, Take 20 mg by mouth 3 (three) times daily as needed. , Disp: , Rfl:    venlafaxine XR (EFFEXOR-XR) 75 MG 24 hr capsule, Take 75 mg by mouth daily with breakfast. , Disp: , Rfl:     ALLERGIES   Patient has no known allergies.     REVIEW OF SYSTEMS    Review of Systems:  Gen:  Denies  fever, sweats, chills weight loss  HEENT: Denies blurred vision, double vision, ear pain, eye pain, hearing loss, nose bleeds, sore  throat Cardiac:  No dizziness, chest pain or heaviness, chest tightness,edema, No JVD Resp:   No cough, -sputum production, -shortness of breath,-wheezing, -hemoptysis,  Other:  All other systems negative  BP 100/70 (BP Location: Left Arm, Patient Position: Sitting, Cuff Size: Normal)   Pulse 68   Temp 97.6 F (36.4 C) (Oral)   Ht 5' 10.5" (1.791 m)   Wt 228 lb (103.4 kg)   SpO2 99%   BMI 32.25 kg/m    Physical Examination:   General Appearance: No distress  EYES PERRLA, EOM intact.   NECK Supple, No JVD Pulmonary: normal breath  sounds, No wheezing.  CardiovascularNormal S1,S2.  No m/r/g.   ALL OTHER ROS ARE NEGATIVE     ASSESSMENT/PLAN   50 year old pleasant white male seen today for assessment post COVID infection back in July 2022 with a persistent shortness of breath and dyspnea exertion with intermittent cough and wheezing   At this time his symptoms seem to have resolved  Patient occasionally uses albuterol as needed  He has a history of secondhand smoke exposure   No exacerbation at this time No evidence of heart failure at this time No evidence or signs of infection at this time No respiratory distress No fevers, chills, nausea, vomiting, diarrhea No evidence of lower extremity edema No evidence hemoptysis No indication for prednisone or antibiotics at this time Chest x-ray September 2022 reviewed in detail No significant finding no active disease  No pneumonia    Patient was ordered to get 6-minute walk test however with resolution of symptoms and no shortness of breath 6-minute walk test is no longer needed at this time    MEDICATION ADJUSTMENTS/LABS AND TESTS ORDERED: Albuterol as needed Avoid second hand smoke   CURRENT MEDICATIONS REVIEWED AT LENGTH WITH PATIENT TODAY   Patient  satisfied with Plan of action and management. All questions answered  Follow up in 1 year Total time spent 21 mins    Zaidyn Claire Santiago Glad, M.D.  [facility]  Medical Director [facility] Medical Director [facility] Cardio-Pulmonary Department

## 2021-04-05 ENCOUNTER — Other Ambulatory Visit: Payer: Self-pay | Admitting: Neurology

## 2021-04-05 DIAGNOSIS — R519 Headache, unspecified: Secondary | ICD-10-CM

## 2021-04-09 ENCOUNTER — Ambulatory Visit
Admission: RE | Admit: 2021-04-09 | Discharge: 2021-04-09 | Disposition: A | Discharge status: Home / Self Care | Payer: Medicaid Other | Source: Ambulatory Visit | Attending: Neurology | Admitting: Neurology

## 2021-04-09 ENCOUNTER — Other Ambulatory Visit: Payer: Self-pay

## 2021-04-09 DIAGNOSIS — R519 Headache, unspecified: Secondary | ICD-10-CM | POA: Diagnosis not present

## 2021-04-09 MED ORDER — GADOBUTROL 1 MMOL/ML IV SOLN
10.0000 mL | Freq: Once | INTRAVENOUS | Status: AC | PRN
  Administered 2021-04-09: 10 mL via INTRAVENOUS

## 2021-05-01 ENCOUNTER — Encounter: Payer: Self-pay | Admitting: Physician Assistant

## 2021-05-01 ENCOUNTER — Other Ambulatory Visit (INDEPENDENT_AMBULATORY_CARE_PROVIDER_SITE_OTHER): Payer: Medicare HMO

## 2021-05-01 ENCOUNTER — Ambulatory Visit (INDEPENDENT_AMBULATORY_CARE_PROVIDER_SITE_OTHER): Payer: Medicare HMO | Admitting: Physician Assistant

## 2021-05-01 VITALS — BP 129/91 | HR 58 | Resp 20 | Ht 72.0 in | Wt 223.0 lb

## 2021-05-01 DIAGNOSIS — G309 Alzheimer's disease, unspecified: Secondary | ICD-10-CM

## 2021-05-01 DIAGNOSIS — G3109 Other frontotemporal dementia: Secondary | ICD-10-CM | POA: Diagnosis not present

## 2021-05-01 DIAGNOSIS — F0280 Dementia in other diseases classified elsewhere without behavioral disturbance: Secondary | ICD-10-CM

## 2021-05-01 LAB — CBC
HCT: 39.2 % (ref 39.0–52.0)
Hemoglobin: 12.8 g/dL — ABNORMAL LOW (ref 13.0–17.0)
MCHC: 32.6 g/dL (ref 30.0–36.0)
MCV: 90.2 fl (ref 78.0–100.0)
Platelets: 555 10*3/uL — ABNORMAL HIGH (ref 150.0–400.0)
RBC: 4.34 Mil/uL (ref 4.22–5.81)
RDW: 14.9 % (ref 11.5–15.5)
WBC: 9.7 10*3/uL (ref 4.0–10.5)

## 2021-05-01 LAB — COMPREHENSIVE METABOLIC PANEL
ALT: 40 U/L (ref 0–53)
AST: 16 U/L (ref 0–37)
Albumin: 4.1 g/dL (ref 3.5–5.2)
Alkaline Phosphatase: 45 U/L (ref 39–117)
BUN: 20 mg/dL (ref 6–23)
CO2: 32 mEq/L (ref 19–32)
Calcium: 9.6 mg/dL (ref 8.4–10.5)
Chloride: 100 mEq/L (ref 96–112)
Creatinine, Ser: 1.13 mg/dL (ref 0.40–1.50)
GFR: 74.72 mL/min (ref >60.00)
Glucose, Bld: 79 mg/dL (ref 70–99)
Potassium: 4.9 mEq/L (ref 3.5–5.1)
Sodium: 138 mEq/L (ref 135–145)
Total Bilirubin: 0.8 mg/dL (ref 0.2–1.2)
Total Protein: 6.8 g/dL (ref 6.0–8.3)

## 2021-05-01 LAB — C-REACTIVE PROTEIN: CRP: <1 mg/dL (ref 0.5–20.0)

## 2021-05-01 LAB — SEDIMENTATION RATE: Sed Rate: 21 mm/hr — ABNORMAL HIGH (ref 0–20)

## 2021-05-01 LAB — AMMONIA: Ammonia: 24 umol/L (ref 11–35)

## 2021-05-01 NOTE — Progress Notes (Signed)
? ? ?Assessment/Plan:  ? ?Chad Stone is a very pleasant 51 y.o. year old RH male with  a history of hypertension, hyperlipidemia, glaucoma, depression, 1 cm L frontal meningioma, migraine headaches,  OSA on CPAP, seen today for evaluation of memory loss. He had initially been seen at [facility] at [facility] in [place], [place],  Dr. Tanna Furry (11/24/2020) with findings suspicious for FTD, MRI showing R temporal, R frontal, Left Frontal atrophy. MoCA today is 10/30 with deficiencies in most areas, especially in visuospatial executive 1 out of 5, naming, attention, delayed recall 3/5, orientation 0/5.   ? ? ? Recommendations:  ? 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 ?Check B12, TSH, RPR, sed rate, ANA, ammonia, HIV, CMP, CBC ?Neurocognitive testing to evaluate other additional causes of memory loss including depression, anxiety, attention deficit, and to further delineate the  diagnosis of frontotemporal dementia and trajectory. ?Discussed safety both in and out of the home.  ?Lumbar puncture for further diagnosis ?Metabolic brain PET scan to further evaluate for frontotemporal dementia ?Discussed the importance of regular daily schedule to maintain brain function.  ?Continue to monitor mood with PCP with Adderall, and Effexor, Lexapro ?Stay active at least 30 minutes at least 3 times a week.  ?Continue CPAP for OSA ?Naps should be scheduled and should be no longer than 60 minutes and should not occur after 2 PM.  ?Control cardiovascular risk factors  ?Mediterranean diet is recommended  ?Referral to speech therapy ?Continue donepezil 10 mg daily, currently as per PCP ?Folllow up in 3 months ?    ?Subjective:  ? ? ?The patient is seen in neurologic consultation at the request of Chad Arbour, MD for the evaluation of memory.  The patient is accompanied by his mother who supplements the history. ?This is a Chad y.o. year old RH  male who has had memory issues for about 3  years.  However, about 1 year ago, the patient began having problems with his job performance, requiring to stop working because he could not follow instructions unless a task were being shown to him first.  He also noted that he could not hold a tool properly for its use.  Eventually, he needed assistance to pay bills or to remember to take his medications, his sister is now in charge.  He now struggles remembering days, names of places and people, especially over the last year.  He is now having some difficulty with language, "having a hard time thinking of the word, taking longer than before ".  He denies repeating himself.  He denies living objects in unusual places, but he does lose things.  He is also showing repetitive behavior, like playing with objects.  He has increasing dependence.  He denies any concerns for seizures (although he had seizures at age 51 after a febrile illness without recurrence), myoclonus, tremor, or sensory loss.  No history of TBI. ?He denies any significant mood changes, although he is on antidepressants.  He denies any hallucinations, impulsivity, or irritability.  His major symptoms have been increasing fatigue, daytime sleepiness, mild gait instability and slower pace, without weakness, rigidity or tremors.  No history of stroke. He is on physical therapy.  At night, he reports awakening more than 3 times per night, with nocturia, but he denies any REM behavior, or vivid dreams.  He has a history of right temporal headaches, denies double vision, dizziness, focal numbness or tingling, unilateral weakness.  Denies alcohol or tobacco.  He has a  history of OSA, his mother states that he needs CPAP, but the patient denies.  No wandering behavior.  He no longer drives.  No hygiene concerns.Family History remarkable for grandmother with Alzheimer's disease ?  ? ?MRI brain with and without contrast 04/09/2021: 1. Findings concerning for frontotemporal degeneration with mild interval  progression of volume loss since prior MRI, more noticeable in the right temporal lobe. 2. Inflammatory paranasal sinus disease. Progressing compared to prior MRI.  ? Inciting Event: There was not an inciting event that coincided with the onset of the memory problems. ?Living Situation: lives by himself with father, mother and sister about 2 miles away.  ? ? ?No Known Allergies ? ?Current Outpatient Medications  ?Medication Instructions  ? albuterol (VENTOLIN HFA) 108 (90 Base) MCG/ACT inhaler 1-2 puffs, Inhalation, Every 6 hours PRN  ? amphetamine-dextroamphetamine (ADDERALL) 10 MG tablet 10 mg, Oral, Daily with breakfast  ? escitalopram (LEXAPRO) 10 mg, Oral, Daily  ? etodolac (LODINE) 500 mg, Oral, 2 times daily  ? latanoprost (XALATAN) 0.005 % ophthalmic solution 1 drop, Both Eyes, Daily at bedtime  ? Naproxen Sodium (ALEVE PO) 500 mg/kg/hr of elemental calcium, Oral, Daily PRN  ? olmesartan-hydrochlorothiazide (BENICAR HCT) 20-12.5 MG tablet 1 tablet, Oral, Daily  ? rosuvastatin (CRESTOR) 10 mg, Oral, Daily  ? venlafaxine XR (EFFEXOR-XR) 75 mg, Oral, Daily with breakfast  ? ? ? ?VITALS:   ?Vitals:  ? 05/01/21 0746  ?BP: (!) 129/91  ?Pulse: (!) 58  ?Resp: 20  ?SpO2: 97%  ?Weight: 223 lb (101.2 kg)  ?Height: 6' (1.829 m)  ? ?   ? View : No data to display.  ?  ?  ?  ? ? ?PHYSICAL EXAM  ? ?HEENT:  Normocephalic, atraumatic. The mucous membranes are moist. The superficial temporal arteries are without ropiness or tenderness. ?Cardiovascular: Regular rate and rhythm. ?Lungs: Clear to auscultation bilaterally. ?Neck: There are no carotid bruits noted bilaterally. ? ?NEUROLOGICAL: ? ?  05/02/2021  ?  6:00 AM  ?Montreal Cognitive Assessment   ?Visuospatial/ Executive (0/5) 1  ?Naming (0/3) 2  ?Attention: Read list of digits (0/2) 0  ?Attention: Read list of letters (0/1) 1  ?Attention: Serial 7 subtraction starting at 100 (0/3) 0  ?Language: Repeat phrase (0/2) 2  ?Language : Fluency (0/1) 0  ?Abstraction (0/2) 1   ?Delayed Recall (0/5) 3  ?Orientation (0/6) 0  ?Total 10  ?Adjusted Score (based on education) 10  ?  ?   ? View : No data to display.  ?  ?  ?  ?  ? ?Orientation:  Alert and oriented to person, not to place or time.  No aphasia or dysarthria. Fund of knowledge is reduced. Recent memory impaired and remote memory impaired  Attention and concentration are reduced..  Able to name objects 2/3 and repeat phrases. Delayed recall  3/5  ?Cranial nerves: There is good facial symmetry. Extraocular muscles are intact and visual fields are full to confrontational testing. Speech is not  fluent but clear. Soft palate rises symmetrically and there is no tongue deviation. Hearing is intact to conversational tone. ?Tone: Tone is good throughout. ?Sensation: Sensation is intact to light touch and pinprick throughout. Vibration is intact at the bilateral big toe.There is no extinction with double simultaneous stimulation. There is no sensory dermatomal level identified. ?Coordination: The patient has no difficulty with RAM's or FNF bilaterally. Normal finger to nose  ?Motor: Strength is 5/5 in the bilateral upper and lower extremities. There is no pronator drift.  There are no fasciculations noted. ?DTR's: Deep tendon reflexes are 2/4 at the bilateral biceps, triceps, brachioradialis, patella and achilles.  Plantar responses are downgoing bilaterally. ?Gait and Station: The patient is able to ambulate without difficulty. The patient is able to heel toe walk without any difficulty.The patient is able to ambulate but with reduced arm swing. The patient is able to stand in the Romberg position. ?  ? ? ?Thank you for allowing [place] the opportunity to participate in the care of this nice patient. Please do not hesitate to contact us for any questions or concerns.  ? ?Total time spent on today's visit was 60 minutes, including both face-to-face time and nonface-to-face time.  Time included that spent on review of records (prior notes  available to me/labs/imaging if pertinent), discussing treatment and goals, answering patient's questions and coordinating care. ? ?Cc:  Chad Arbour, MD ? ?Marlowe Kays ?05/02/2021 7:19 AM   ?

## 2021-05-01 NOTE — Patient Instructions (Addendum)
It was a pleasure to see you today at our office.  ? ?Recommendations: ? ?Neurocognitive evaluation at our office ?Speech therapy  ? Brain PET scan of the brain the radiology office will call you to arrange you appointment ?Check labs today ?Lumbar Puncture  ?Follow up  3 months ?Continue Aricept  ? ?RECOMMENDATIONS FOR ALL PATIENTS WITH MEMORY PROBLEMS: ?1. Continue to exercise (Recommend 30 minutes of walking everyday, or 3 hours every week) ?2. Increase social interactions - continue going to [place] and enjoy social gatherings with friends and family ?3. Eat healthy, avoid fried foods and eat more fruits and vegetables ?4. Maintain adequate blood pressure, blood sugar, and blood cholesterol level. Reducing the risk of stroke and cardiovascular disease also helps promoting better memory. ?5. Avoid stressful situations. Live a simple life and avoid aggravations. Organize your time and prepare for the next day in anticipation. ?6. Sleep well, avoid any interruptions of sleep and avoid any distractions in the bedroom that may interfere with adequate sleep quality ?7. Avoid sugar, avoid sweets as there is a strong link between excessive sugar intake, diabetes, and cognitive impairment ?We discussed the Mediterranean diet, which has been shown to help patients reduce the risk of progressive memory disorders and reduces cardiovascular risk. This includes eating fish, eat fruits and green leafy vegetables, nuts like almonds and hazelnuts, walnuts, and also use olive oil. Avoid fast foods and fried foods as much as possible. Avoid sweets and sugar as sugar use has been linked to worsening of memory function. ? ?There is always a concern of gradual progression of memory problems. If this is the case, then we may need to adjust level of care according to patient needs. Support, both to the patient and caregiver, should then be put into place.  ? ? ? ? ?You have been referred for a neuropsychological evaluation (i.e.,  evaluation of memory and thinking abilities). Please bring someone with you to this appointment if possible, as it is helpful for the doctor to hear from both you and another adult who knows you well. Please bring eyeglasses and hearing aids if you wear them.  ?  ?The evaluation will take approximately 3 hours and has two parts: ?  ?The first part is a clinical interview with the neuropsychologist (Dr. Milbert Coulter or Dr. Roseanne Reno). During the interview, the neuropsychologist will speak with you and the individual you brought to the appointment.  ?  ?The second part of the evaluation is testing with the doctor's technician Annabelle Harman or Selena Batten). During the testing, the technician will ask you to remember different types of material, solve problems, and answer some questionnaires. Your family member will not be present for this portion of the evaluation. ?  ?Please note: We must reserve several hours of the neuropsychologist's time and the psychometrician's time for your evaluation appointment. As such, there is a No-Show fee of $100. If you are unable to attend any of your appointments, please contact our office as soon as possible to reschedule.  ? ? ?FALL PRECAUTIONS: Be cautious when walking. Scan the area for obstacles that may increase the risk of trips and falls. When getting up in the mornings, sit up at the edge of the bed for a few minutes before getting out of bed. Consider elevating the bed at the head end to avoid drop of blood pressure when getting up. Walk always in a well-lit room (use night lights in the walls). Avoid area rugs or power cords from appliances in the middle  of the walkways. Use a walker or a cane if necessary and consider physical therapy for balance exercise. Get your eyesight checked regularly. ? ?FINANCIAL OVERSIGHT: Supervision, especially oversight when making financial decisions or transactions is also recommended. ? ?HOME SAFETY: Consider the safety of the kitchen when operating appliances like  stoves, microwave oven, and blender. Consider having supervision and share cooking responsibilities until no longer able to participate in those. Accidents with firearms and other hazards in the house should be identified and addressed as well. ? ? ?ABILITY TO BE LEFT ALONE: If patient is unable to contact 911 operator, consider using LifeLine, or when the need is there, arrange for someone to stay with patients. Smoking is a fire hazard, consider supervision or cessation. Risk of wandering should be assessed by caregiver and if detected at any point, supervision and safe proof recommendations should be instituted. ? ?MEDICATION SUPERVISION: Inability to self-administer medication needs to be constantly addressed. Implement a mechanism to ensure safe administration of the medications. ? ? ?DRIVING: Regarding driving, in patients with progressive memory problems, driving will be impaired. We advise to have someone else do the driving if trouble finding directions or if minor accidents are reported. Independent driving assessment is available to determine safety of driving. ? ? ?If you are interested in the driving assessment, you can contact the following: ? ?[employer] [place] [phone] ? ?[facility] [phone] ? ?[facility] [phone] ? ?[facility] [phone] or [phone] ? ? ? ?Mediterranean Diet ?A Mediterranean diet refers to food and lifestyle choices that are based on the traditions of countries located on the [place]. This way of eating has been shown to help prevent certain conditions and improve outcomes for people who have chronic diseases, like kidney disease and heart disease. ?What are tips for following this plan? ?Lifestyle  ?Cook and eat meals together with your family, when possible. ?Drink enough fluid to keep your urine clear or pale yellow. ?Be physically active every day. This includes: ?Aerobic exercise like running  or swimming. ?Leisure activities like gardening, walking, or housework. ?Get 7-8 hours of sleep each night. ?If recommended by your health care provider, drink red wine in moderation. This means 1 glass a day for nonpregnant women and 2 glasses a day for men. A glass of wine equals 5 oz (150 mL). ?Reading food labels  ?Check the serving size of packaged foods. For foods such as rice and pasta, the serving size refers to the amount of cooked product, not dry. ?Check the total fat in packaged foods. Avoid foods that have saturated fat or trans fats. ?Check the ingredients list for added sugars, such as corn syrup. ?Shopping  ?At the grocery store, buy most of your food from the areas near the walls of the store. This includes: ?Fresh fruits and vegetables (produce). ?Grains, beans, nuts, and seeds. Some of these may be available in unpackaged forms or large amounts (in bulk). ?Fresh seafood. ?Poultry and eggs. ?Low-fat dairy products. ?Buy whole ingredients instead of prepackaged foods. ?Buy fresh fruits and vegetables in-season from local farmers markets. ?Buy frozen fruits and vegetables in resealable bags. ?If you do not have access to quality fresh seafood, buy precooked frozen shrimp or canned fish, such as tuna, salmon, or sardines. ?Buy small amounts of raw or cooked vegetables, salads, or olives from the deli or salad bar at your store. ?Stock your pantry so you always have certain foods on hand, such as olive oil, canned tuna,  canned tomatoes, rice, pasta, and beans. ?Cooking  ?Cook foods with extra-virgin olive oil instead of using butter or other vegetable oils. ?Have meat as a side dish, and have vegetables or grains as your main dish. This means having meat in small portions or adding small amounts of meat to foods like pasta or stew. ?Use beans or vegetables instead of meat in common dishes like chili or lasagna. ?Experiment with different cooking methods. Try roasting or broiling vegetables instead of  steaming or saut?eing them. ?Add frozen vegetables to soups, stews, pasta, or rice. ?Add nuts or seeds for added healthy fat at each meal. You can add these to yogurt, salads, or vegetable dishes. ?Marinate

## 2021-05-02 ENCOUNTER — Telehealth: Payer: Self-pay | Admitting: Physician Assistant

## 2021-05-02 ENCOUNTER — Encounter: Payer: Self-pay | Admitting: Physician Assistant

## 2021-05-02 LAB — HIV ANTIBODY (ROUTINE TESTING W REFLEX): HIV 1&2 Ab, 4th Generation: NONREACTIVE

## 2021-05-02 LAB — RPR: RPR Ser Ql: NONREACTIVE

## 2021-05-02 LAB — ANA W/REFLEX: Anti Nuclear Antibody (ANA): NEGATIVE

## 2021-05-02 NOTE — Telephone Encounter (Signed)
Chad Stone spoke with [facility].  ?

## 2021-05-02 NOTE — Telephone Encounter (Signed)
Sent reply to [employer] ?  ?

## 2021-05-02 NOTE — Telephone Encounter (Signed)
[facility] called in and left a message with the access nurse. They need to speak with someone about punctures that should be ordered. I have placed the full access nurse report in [facility] box. ?

## 2021-05-07 ENCOUNTER — Telehealth: Payer: Self-pay

## 2021-05-07 NOTE — Telephone Encounter (Signed)
PET scan of brain denied. Placed on Sara's desk to review for further instructions.   ?

## 2021-05-08 ENCOUNTER — Ambulatory Visit: Payer: Medicare HMO | Attending: Physician Assistant | Admitting: Speech Pathology

## 2021-05-08 DIAGNOSIS — R41841 Cognitive communication deficit: Secondary | ICD-10-CM | POA: Insufficient documentation

## 2021-05-08 DIAGNOSIS — R4701 Aphasia: Secondary | ICD-10-CM | POA: Diagnosis present

## 2021-05-09 ENCOUNTER — Other Ambulatory Visit: Payer: Medicare HMO

## 2021-05-09 ENCOUNTER — Encounter: Payer: Self-pay | Admitting: Speech Pathology

## 2021-05-09 NOTE — Therapy (Signed)
?[facility] ?[address] ?[place], [place], [address] ?Phone: [phone]   Fax:  [phone] ? ?Speech Language Pathology Evaluation ? ?Patient Details  ?Name: Chad Stone ?MRN: [id number] ?Date of Birth: 02/24/1970 ?Referring Provider (SLP): Marlowe KaysSara Wertman, PA-C ? ? ?Encounter Date: 05/08/2021 ? ? End of Session - 05/09/21 1033   ? ? Visit Number 1   ? Number of Visits 25   ? Date for SLP Re-Evaluation 08/07/21   ? Authorization Type Aetna Medicare   ? Authorization - Visit Number 1   ? Progress Note Due on Visit 10   ? SLP Start Time 1505   ? SLP Stop Time  1610   ? SLP Time Calculation (min) 65 min   ? Activity Tolerance Patient tolerated treatment well   ? ?  ?  ? ?  ? ? ?Past Medical History:  ?Diagnosis Date  ? Benign essential HTN   ? Cerebral atrophy, mild (HCC)   ? Fronto-temporal dementia (HCC)   ? Hyperlipidemia   ? Nephrolithiasis   ? OSA (obstructive sleep apnea)   ? ? ?Past Surgical History:  ?Procedure Laterality Date  ? LITHOTRIPSY    ? ? ?There were no vitals filed for this visit. ? ? Subjective Assessment - 05/09/21 1025   ? ? Subjective "I used to be good at math."   ? Patient is accompained by: Family member   mother  ? Currently in Pain? Yes   ? Pain Score 10-Worst pain ever   question accuracy of pt's self-rating abilities; FACES score 4  ? Pain Location Head   headache  ? ?  ?  ? ?  ? ? ? ? ? SLP Evaluation [facility] - 05/09/21 1025   ? ?  ? SLP Visit Information  ? SLP Received On 05/08/21   ? Referring Provider (SLP) Marlowe KaysSara Wertman, PA-C   ? Onset Date 05/01/21   referral date; cognitive impairments noted in 2020  ? Medical Diagnosis Frontotemporal dementia   ? ?  ?  ? ?  ? ? ?SUBJECTIVE:  ? ? ?PERTINENT HISTORY: Chad GowdaChristopher B Stone is a 51 y.o. year old RH male with 3 year history of memory loss, initially [facility] for F[facility], referred for cognitive-linguistic evaluation by Marlowe KaysSara Wertman, PA-C. PMHx also  includes hypertension, hyperlipidemia, glaucoma, depression, 1 cm L frontal meningioma, migraine headaches, OSA on CPAP.  ? ? ?FALLS: Has patient fallen in last 6 months?  No ? ?LIVING ENVIRONMENT: ?Lives with: lives alone and family lives nearby and checks on pt daily; pt eats dinner with family (parents, sister) ?Lives in: House/apartment ? ?PLOF:  ?Level of assistance: Independent with ADLs ?Employment: On disability ? ? ?PATIENT GOALS  Mom: "just for him to communicate better," and memory concerns. ? ?OBJECTIVE:  ? ?DIAGNOSTIC FINDINGS: MRI showing R temporal, R frontal, Left Frontal atrophy. MoCA on 05/01/21 with "deficiencies in most areas, especially in visuospatial executive 1 out of 5, naming, attention, delayed recall 3/5, orientation 0/5." ? ? ?COGNITION: ?Overall cognitive status: Impaired and History of cognitive impairments - at baseline ?Attention: Impaired: Sustained, Selective (digit repetition impaired) ?Memory: Impaired: Working (simple digit manipulation impaired) ?Short term (delayed recall 1/5) ?Prospective  ?Awareness: Impaired: Comment:    Pt initially denied changes in thinking or communication, but looks to mother to report this; suspect pt has difficulty recalling changes vs pure intellectual awareness impairment. Once mother commented, pt able to provide some examples of  changes (difficulty with numbers, difficulty thinking of words) ?Executive function: Impaired: Problem solving, Organization, Planning, and Slow processing. Clock drawing: pt placed all numbers in reverse order (including perseverated numbers and dashes in left upper 1/3 of clock face, no hands present. ?Behavior:  calm and cooperative ?Functional deficits: total assistance with IADLs including shopping, household chores, medication and finance management.  ? ?AUDITORY COMPREHENSION: ?Overall auditory comprehension: Not formally assessed but within functional limits for following simple commands, more difficulty with  multi-step/complex commands ?YES/NO questions: Appears intact ?Following directions: simple commands  ?Conversation: Simple ?Interfering components: processing speed and working memory ?Effective technique: extra processing time, pausing, repetition/stressing words, and visual/gestural cues ? ? ?READING COMPREHENSION: Impaired: sentence ? ?EXPRESSION: verbal ? ?VERBAL EXPRESSION: ?Verbal expression: Impaired: hesitations in verbal responses, which are typically limited to short sentences <6 words, wordfinding difficulties, occasional paraphasias ?Level of generative/spontaneous verbalization: sentence ?Automatic speech: name: intact, social response: intact, counting: impaired (counting to 21: numbers 1-13, skipped to 21), and day of week: with effort and hesitation ?Naming: Responsive: 76-100%, Confrontation: 51-75% named 2 animals in 60 seconds, and Divergent: 0-25% ?Pragmatics: Appears intact ?Comments: very soft-spoken; mother reports this is baseline ?Effective technique: open ended questions, semantic cues, and phonemic cues ?Non-verbal means of communication: N/A ?Functional communication: Impaired: Pt initiates answering questions, frequent hesitation and wordfinding difficulties; looks to mother to assist with   specific details ? ?WRITTEN EXPRESSION: ?Dominant hand: right ?Written expression: Impaired: word (visuospatial deficits are felt to contribute) ?Did not leave space between words, greater difficulty with irregular forms (ninton/nation, nife/knife, coufh/cough) ?Perseverates 11011 multiple times when attempting to write numbers 1-10 ?Cookie Theft writing sample: "Mom is Runner, broadcasting/film/video floweing." ? ?MOTOR SPEECH: ?Overall motor speech: low vocal intensity and breathy vocal quality; per mother this is pt's baseline, however question neurologic component/dysarthria. ?Level of impairment: Word ?Respiration: shallow inspiration ?Phonation: breathy and low vocal intensity, pitch  appears above WNL for gender ?Resonance: WFL ?Articulation: Appears intact ?Intelligibility: Intelligibility reduced; For trained listener with context in a quiet environment, intelligibility rated as approximately 75 % ?Motor planning: Appears intact ?Motor speech errors: none noted ? ? ?ORAL MOTOR EXAMINATION ?Facial : WFL ?Lingual: WFL ?Velum: WFL ?Mandible: WFL ?Cough: WFL ?Voice: Breathy, Other: low vocal intensity , reduced pitch variability ? ? ? ?STANDARDIZED ASSESSMENTS: ? ? [employer] Mental Status (SLUMS) ? ? Total score: 10/30 ? Scoring: 27-30 Normal ?    21-26 Mild Neurocognitive Disorder ?    1-20   Dementia ? ?The [facility] Diagnostic Aphasia Administration (Short Form) Score Summary ?Portions of the revised [place] Diagnostic Aphasia Examination (BDAE) were administered: ? ? ?Domain Raw Score Percentile  ?FLUENCY ?Phrase length ?Melodic Line ?Grammatical Form  ?6/7 ?3/7 ?6/7  ?100  ?CONVERSATION/EXPOSITORY SPEECH ?Simple Social Responses  ?6/7  ?50  ?AUDITORY COMPREHENSION ?Basic Word Discrimination ?Commands ?Complex Ideational Material  ?Not tested  ? ?  ?ARTICULATION ?Articulatory Agility  ?7/7  ?100  ?RECITATION ?Automatized Sentences  ?3/4  ?30  ?REPETITION ?Words ?Sentences  ?Not tested   ?NAMING ?Responsive Naming ?[place] Naming Test ?Special Categories  ?10/10 ?9/15 ?NT  ?100 ?60 ?  ?PARAPHASIA ?Rating from Speech Profile  ?6/7  ?70  ?READING ?Matching Cases and Scripts ?Number Matching ?Picture-Word Matching ?Oral Word Reading ?Oral Sentence Reading ?Oral Sentence Comprehension ?Sentence/Paragraph Comprehension  ?4/4 ?3/4 ?2/4 ?15/15 ?3/5 ?2/3 ?3/4  ?100 ?10 ?20 ?100 ?70 ?50 ?70 ?  ?WRITING ?Form ?Letter Choice ?Motor Facility ?Primer Words ?Regular Phonics ?Common Irregular Words ?Written Picture Naming ?  Narrative Writing  ?7/14 ?10/21 ?5/14 ?3/4 ?1/2 ?0/3 ?NT ?5/11  ?0-10 ?0-10 ?0 ?30 ?50 ?30 ? ?30  ? ? ? ? ? ? ? SLP Education - 05/09/21 1031   ? ? Education Details SLP course would  be focused on compensating/coping with deficits, maximizing independence and QOL vs restorative   ? Person(s) Educated Patient;Parent(s)   ? Methods Explanation   ? Comprehension Verbalized understanding;Need furt

## 2021-05-09 NOTE — Addendum Note (Signed)
Addended by: Aliene Altes on: 05/09/2021 06:05 PM ? ? Modules accepted: Orders ? ?

## 2021-05-14 ENCOUNTER — Telehealth: Payer: Self-pay | Admitting: Physician Assistant

## 2021-05-14 NOTE — Telephone Encounter (Signed)
They got a letter that declined the patient's PET scan. They want to know if an appeal will be done? They are requesting an appeal be done. ?

## 2021-05-15 ENCOUNTER — Ambulatory Visit: Payer: Medicare HMO | Attending: Physician Assistant | Admitting: Speech Pathology

## 2021-05-15 DIAGNOSIS — R4701 Aphasia: Secondary | ICD-10-CM | POA: Diagnosis present

## 2021-05-15 DIAGNOSIS — R41841 Cognitive communication deficit: Secondary | ICD-10-CM | POA: Diagnosis present

## 2021-05-16 NOTE — Therapy (Signed)
[place] ?[facility] ?[place], [place], [address] ?Phone: [phone]   Fax:  [phone] ? ?Speech Language Pathology Treatment ? ?Patient Details  ?Name: Chad Stone ?MRN: [id number] ?Date of Birth: 04-26-1970 ?Referring Provider (SLP): Sharene Butters, PA-C ? ? ?Encounter Date: 05/15/2021 ? ? End of Session - 05/16/21 0858   ? ? Visit Number 2   ? Number of Visits 25   ? Date for SLP Re-Evaluation 08/07/21   ? Authorization Type Aetna Medicare   ? Authorization - Visit Number 2   ? Progress Note Due on Visit 10   ? SLP Start Time 1500   ? SLP Stop Time  1600   ? SLP Time Calculation (min) 60 min   ? Activity Tolerance Patient tolerated treatment well   ? ?  ?  ? ?  ? ? ?Past Medical History:  ?Diagnosis Date  ? Benign essential HTN   ? Cerebral atrophy, mild ([place])   ? Fronto-temporal dementia ([place])   ? Hyperlipidemia   ? Nephrolithiasis   ? OSA (obstructive sleep apnea)   ? ? ?Past Surgical History:  ?Procedure Laterality Date  ? LITHOTRIPSY    ? ? ?There were no vitals filed for this visit. ? ? Subjective Assessment - 05/16/21 0849   ? ? Subjective Pt brought spiral bound notebook vs 3 ring binder   ? Currently in Pain? Yes   ? Pain Score 5    ? Pain Location Head   ? ?  ?  ? ?  ? ? ? ? ? ? ? ? ADULT SLP TREATMENT - 05/16/21 0850   ? ?  ? General Information  ? Behavior/Cognition Alert;Cooperative;Pleasant mood   ? HPI Chad Stone is a 51 y.o. year old RH male with 3 year history of memory loss, initially followed by neurology at [facility] clinic for FTD, referred for cognitive-linguistic evaluation by Sharene Butters, PA-C. PMHx also includes hypertension, hyperlipidemia, glaucoma, depression, 1 cm L frontal meningioma, migraine headaches, OSA on CPAP. MRI showing R temporal, R frontal, Left Frontal atrophy. MoCA on 05/01/21 with "deficiencies in most areas, especially in visuospatial executive 1 out of 5, naming, attention, delayed recall  3/5, orientation 0/5."   ?  ? Treatment Provided  ? Treatment provided Cognitive-Linquistic   ?  ? Pain Assessment  ? Pain Assessment No/denies pain   ?  ? Cognitive-Linquistic Treatment  ? Treatment focused on Cognition;Patient/family/caregiver education   ? Skilled Treatment Worked with pt, mother to attempt to investigate potential setup/structure for external memory aids. Journaling activity: Pt wrote month/date next to days of the week (Sunday, Monday, Tuesday) with mod cues, with poor legibility, visuospatial errors (text moves diagonally and eventually vertically instead of in a straight line), and written paraphasias/number errors). Pt wrote words and phrases of up to 5 words describing activities on 2 preceeding days with mod cues for recall from mother. Similar errors due with legibility, spelling, and spacing. Transitioned to simple monthly calendar; pt able to cross off completed days with min cues. Pt wrote time and 1-2 words to describe upcoming appointments for the month with usual mod cues. Worked with pt and mother to compile daily schedule template to reinforce pt routines; provided laminated copy with dry erase marker. Education to pt and mother that family support will be necessary for pt to repeat use of schedule enough to create procedural memory for this.   ?  ? Assessment / Recommendations / Plan  ?  Plan Continue with current plan of care   ?  ? Progression Toward Goals  ? Progression toward goals Progressing toward goals   ? ?  ?  ? ?  ? ? ? SLP Education - 05/16/21 0857   ? ? Education Details use of routines and structures to support pt in maintaining safety/independence for as long as possible   ? Person(s) Educated Patient;Parent(s)   ? Methods Explanation   ? Comprehension Verbalized understanding   ? ?  ?  ? ?  ? ? ? SLP Short Term Goals - 05/09/21 1215   ? ?  ? SLP SHORT TERM GOAL #1  ? Title Pt will establish external aid for functional recall and bring to >75% of therapy sessions.    ? Time 6   ? Period Weeks   ? Status New   ? Target Date 06/20/21   ?  ? SLP SHORT TERM GOAL #2  ? Title Patient will generate sentence responses to simple questions >90% accuracy with mod cues.   ? Time 6   ? Period Weeks   ? Status New   ? Target Date 06/20/21   ?  ? SLP SHORT TERM GOAL #3  ? Title Pt will engage in simple conversation 3-5 minutes with supported conversation strategies or visual aid if necessary.   ? Time 6   ? Period Weeks   ? Status New   ? Target Date 06/20/21   ? ?  ?  ? ?  ? ? ? SLP Long Term Goals - 05/09/21 1225   ? ?  ? SLP LONG TERM GOAL #1  ? Title Pt will use external aid for functional recall of completed/upcoming activities 80% accuracy with rare min A.   ? Time 12   ? Period Weeks   ? Status New   ? Target Date 08/06/21   ?  ? SLP LONG TERM GOAL #2  ? Title Patient will engage in simple-mod complex conversation for 5-8 minutes about a topic of interest, with supported conversation or use of visual aids if needed.   ? Time 12   ? Period Weeks   ? Status New   ? Target Date 08/06/21   ?  ? SLP LONG TERM GOAL #3  ? Title Patient will demonstrate knowledge of appropriate activities to support language function outside of ST with assistance from family.   ? Time 12   ? Period Weeks   ? Status New   ? Target Date 08/06/21   ? ?  ?  ? ?  ? ? ? Plan - 05/16/21 0901   ? ? Clinical Impression Statement Chad Stone is a pleasant 51 y.o. gentleman who presents with what appear to be moderate-severe cognitive communication and moderate language impairments. Pt today with reduced awareness of deficits as well as signifant visuospatial and linguistic impairments which may impact his ability to use visual aids. Education to pt and family on importance of setting up routines and structure to allow pt to maintain safety and independence for as long as possible. Family support will be crucial for pt to provide consistency/reminders and promote carryover of trained therapy strategies. I  recommend course of skilled ST focused on compensation and strategy training to increase safety, prolong independence and improve social interactions and quality of life. Will consider use of AAC and/or external aids to improve pt's ability to communicate with family.   ? Speech Therapy Frequency 2x / week   ? Duration  12 weeks   estimate only 8 weeks necessary unless pt shows potential with alternative communication device  ? Treatment/Interventions Language facilitation;Environmental controls;SLP instruction and feedback;Compensatory techniques;Functional tasks;Compensatory strategies;Internal/external aids;Multimodal communcation approach;Patient/family education   ? Potential to Achieve Goals Good   -fair  ? Potential Considerations Ability to learn/carryover information;Previous level of function;Severity of impairments   ? ?  ?  ? ?  ? ? ?Patient will benefit from skilled therapeutic intervention in order to improve the following deficits and impairments:   ?Cognitive communication deficit ? ?Aphasia ? ? ? ?Problem List ?Patient Active Problem List  ? Diagnosis Date Noted  ? Cerebral atrophy, mild ([place]) 12/14/2018  ? OSA (obstructive sleep apnea) 05/20/2018  ? Benign essential hypertension 06/05/2015  ? Hyperlipidemia, mixed 06/05/2015  ? Nephrolithiasis 05/20/2013  ? ?Deneise Lever, MS, CCC-SLP ?Speech-Language Pathologist ?[phone] ? ?Aliene Altes, CCC-SLP ?05/16/2021, 9:02 AM ? ?[place] ?[facility] ?[place], [place], [address] ?Phone: [phone]   Fax:  [phone] ? ? ?Name: Chad Stone ?MRN: [id number] ?Date of Birth: Nov 21, 1970 ? ?

## 2021-05-17 ENCOUNTER — Ambulatory Visit: Payer: Medicare HMO | Admitting: Speech Pathology

## 2021-05-17 ENCOUNTER — Encounter: Payer: Self-pay | Admitting: Physician Assistant

## 2021-05-17 DIAGNOSIS — R4701 Aphasia: Secondary | ICD-10-CM

## 2021-05-17 DIAGNOSIS — R41841 Cognitive communication deficit: Secondary | ICD-10-CM | POA: Diagnosis not present

## 2021-05-17 NOTE — Progress Notes (Signed)
Despite several attempts, medical studies including a PET scan FDG, as well as LP were denied for further investigation of frontotemporal dementia.  Unfortunately, automated system at telephone number [phone], yielded the message saying "no appeal will be allowed for days patient regarding the studies ".  ?

## 2021-05-17 NOTE — Progress Notes (Signed)
I contacted patient, advised of mulitple attempts to get NM Pet Metabolic brain ordered. Denied, will discuss on follow up.  ?

## 2021-05-17 NOTE — Therapy (Signed)
[place] ?[facility] ?[address] ?[place], [place], [address] ?Phone: [phone]   Fax:  [phone] ? ?Speech Language Pathology Treatment ? ?Patient Details  ?Name: Chad Stone ?MRN: [id number] ?Date of Birth: Jan 14, 1971 ?Referring Provider (SLP): Marlowe Kays, PA-C ? ? ?Encounter Date: 05/17/2021 ? ? End of Session - 05/17/21 1712   ? ? Visit Number 3   ? Number of Visits 25   ? Date for SLP Re-Evaluation 08/07/21   ? Authorization Type Aetna Medicare   ? Authorization - Visit Number 3   ? Progress Note Due on Visit 10   ? SLP Start Time 1600   ? SLP Stop Time  1655   ? SLP Time Calculation (min) 55 min   ? Activity Tolerance Patient tolerated treatment well   ? ?  ?  ? ?  ? ? ?Past Medical History:  ?Diagnosis Date  ? Benign essential HTN   ? Cerebral atrophy, mild (HCC)   ? Fronto-temporal dementia (HCC)   ? Hyperlipidemia   ? Nephrolithiasis   ? OSA (obstructive sleep apnea)   ? ? ?Past Surgical History:  ?Procedure Laterality Date  ? LITHOTRIPSY    ? ? ?There were no vitals filed for this visit. ? ? Subjective Assessment - 05/17/21 1703   ? ? Subjective Pt brought schedule with current date, all calendar days marked off correctly   ? Patient is accompained by: --   friend from church  ? Currently in Pain? No/denies   ? ?  ?  ? ?  ? ? ? ? ? ? ? ? ADULT SLP TREATMENT - 05/17/21 1704   ? ?  ? General Information  ? Behavior/Cognition Alert;Cooperative;Pleasant mood   ? HPI Chad Stone is a 51 y.o. year old RH male with 3 year history of memory loss, initially followed by neurology at [facility] for FTD, referred for cognitive-linguistic evaluation by Marlowe Kays, PA-C. PMHx also includes hypertension, hyperlipidemia, glaucoma, depression, 1 cm L frontal meningioma, migraine headaches, OSA on CPAP. MRI showing R temporal, R frontal, Left Frontal atrophy. MoCA on 05/01/21 with "deficiencies in most areas, especially in visuospatial executive 1  out of 5, naming, attention, delayed recall 3/5, orientation 0/5."   ?  ? Cognitive-Linquistic Treatment  ? Treatment focused on Cognition;Patient/family/caregiver education   ? Skilled Treatment Education on cognitive activities daily to stimulate thinking and language. Evaluated appropriate tasks to begin customizing pt's home exercise program. Simple reasoning/categorization accuracy 60%, improves to 80% with mod cues.  Finding functional information (prescription label) 60% accuracy, 80% with mod cues. Image recall from F:6 80% accuracy; pt benefitted from verbalizing item. Initiated training in semantic feature analysis; as task progressed able to fade cues from usual mod to occasional min question cues to describe 4-5 attributes of pictured everyday items.  With mod cues, pt reviewed calendar to locate upcoming activities and record new events ([address] [place], [place], [place] meeting). Pt required written model to write some words correctly.   ?  ? Assessment / Recommendations / Plan  ? Plan Continue with current plan of care   ?  ? Progression Toward Goals  ? Progression toward goals Progressing toward goals   ? ?  ?  ? ?  ? ? ? SLP Education - 05/17/21 1711   ? ? Education Details cognitive activities for home   ? Person(s) Educated Secretary/administrator)   ? Methods Explanation   ? Comprehension Verbalized understanding   ? ?  ?  ? ?  ? ? ?  SLP Short Term Goals - 05/09/21 1215   ? ?  ? SLP SHORT TERM GOAL #1  ? Title Pt will establish external aid for functional recall and bring to >75% of therapy sessions.   ? Time 6   ? Period Weeks   ? Status New   ? Target Date 06/20/21   ?  ? SLP SHORT TERM GOAL #2  ? Title Patient will generate sentence responses to simple questions >90% accuracy with mod cues.   ? Time 6   ? Period Weeks   ? Status New   ? Target Date 06/20/21   ?  ? SLP SHORT TERM GOAL #3  ? Title Pt will engage in simple conversation 3-5 minutes with supported conversation strategies or visual aid if  necessary.   ? Time 6   ? Period Weeks   ? Status New   ? Target Date 06/20/21   ? ?  ?  ? ?  ? ? ? SLP Long Term Goals - 05/09/21 1225   ? ?  ? SLP LONG TERM GOAL #1  ? Title Pt will use external aid for functional recall of completed/upcoming activities 80% accuracy with rare min A.   ? Time 12   ? Period Weeks   ? Status New   ? Target Date 08/06/21   ?  ? SLP LONG TERM GOAL #2  ? Title Patient will engage in simple-mod complex conversation for 5-8 minutes about a topic of interest, with supported conversation or use of visual aids if needed.   ? Time 12   ? Period Weeks   ? Status New   ? Target Date 08/06/21   ?  ? SLP LONG TERM GOAL #3  ? Title Patient will demonstrate knowledge of appropriate activities to support language function outside of ST with assistance from family.   ? Time 12   ? Period Weeks   ? Status New   ? Target Date 08/06/21   ? ?  ?  ? ?  ? ? ? Plan - 05/17/21 1712   ? ? Clinical Impression Statement Chad Stone is a pleasant 51 y.o. gentleman with moderate-severe cognitive communication and moderate language impairments. Pt today with reduced awareness of deficits as well as signifant visuospatial and linguistic impairments which may impact his ability to use visual aids. Will focus on education for pt and family on importance of setting up routines and structure to allow pt to maintain safety and independence for as long as possible, and on training in cognitive home program. Family support will be crucial for pt to provide consistency/reminders and promote carryover of trained therapy strategies. I recommend course of skilled ST focused on compensation and strategy training to increase safety, prolong independence and improve social interactions and quality of life. Will consider use of AAC and/or external aids to improve pt's ability to communicate with family.   ? Speech Therapy Frequency 2x / week   ? Duration 12 weeks   estimate only 8 weeks necessary unless pt shows potential  with alternative communication device  ? Treatment/Interventions Language facilitation;Environmental controls;SLP instruction and feedback;Compensatory techniques;Functional tasks;Compensatory strategies;Internal/external aids;Multimodal communcation approach;Patient/family education   ? Potential to Achieve Goals Good   -fair  ? Potential Considerations Ability to learn/carryover information;Previous level of function;Severity of impairments   ? ?  ?  ? ?  ? ? ?Patient will benefit from skilled therapeutic intervention in order to improve the following deficits and impairments:   ?Cognitive communication  deficit ? ?Aphasia ? ? ? ?Problem List ?Patient Active Problem List  ? Diagnosis Date Noted  ? Cerebral atrophy, mild (HCC) 12/14/2018  ? OSA (obstructive sleep apnea) 05/20/2018  ? Benign essential hypertension 06/05/2015  ? Hyperlipidemia, mixed 06/05/2015  ? Nephrolithiasis 05/20/2013  ? ?Rondel Baton, MS, CCC-SLP ?Speech-Language Pathologist ?[phone] ? ?Arlana Lindau, CCC-SLP ?05/17/2021, 5:13 PM ? ?[place] ?[facility] ?[address] ?[place], [place], [address] ?Phone: [phone]   Fax:  [phone] ? ? ?Name: Chad Stone ?MRN: [id number] ?Date of Birth: Oct 14, 1970 ? ?

## 2021-05-22 ENCOUNTER — Ambulatory Visit: Payer: Medicare HMO | Admitting: Speech Pathology

## 2021-05-24 ENCOUNTER — Ambulatory Visit: Payer: Medicare HMO | Admitting: Speech Pathology

## 2021-05-24 DIAGNOSIS — R41841 Cognitive communication deficit: Secondary | ICD-10-CM

## 2021-05-24 DIAGNOSIS — R4701 Aphasia: Secondary | ICD-10-CM

## 2021-05-24 NOTE — Therapy (Signed)
[place] ?[facility] ?[place], [place], [address] ?Phone: [phone]   Fax:  [phone] ? ?Speech Language Pathology Treatment ? ?Patient Details  ?Name: Chad Stone ?MRN: [id number] ?Date of Birth: 31-Jan-1970 ?Referring Provider (SLP): Sharene Butters, PA-C ? ? ?Encounter Date: 05/24/2021 ? ? End of Session - 05/24/21 1743   ? ? Visit Number 4   ? Number of Visits 25   ? Date for SLP Re-Evaluation 08/07/21   ? Authorization Type Aetna Medicare   ? Authorization - Visit Number 4   ? Progress Note Due on Visit 10   ? SLP Start Time 1500   ? SLP Stop Time  1600   ? SLP Time Calculation (min) 60 min   ? Activity Tolerance Patient tolerated treatment well   ? ?  ?  ? ?  ? ? ?Past Medical History:  ?Diagnosis Date  ? Benign essential HTN   ? Cerebral atrophy, mild ([place])   ? 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 Hyperlipidemia   ? Nephrolithiasis   ? OSA (obstructive sleep apnea)   ? ? ?Past Surgical History:  ?Procedure Laterality Date  ? LITHOTRIPSY    ? ? ?There were no vitals filed for this visit. ? ? Subjective Assessment - 05/24/21 1725   ? ? Subjective Pt brought tablet today   ? Patient is accompained by: Family member   mother  ? Currently in Pain? Yes   ? Pain Score --   FACES 4  ? Pain Orientation --   generalized  ? ?  ?  ? ?  ? ? ? ? ? ? ? ? ADULT SLP TREATMENT - 05/24/21 1726   ? ?  ? General Information  ? Behavior/Cognition Alert;Cooperative;Pleasant mood   ? HPI [name] is a 51 y.o. year old RH male with 3 year history of memory loss, initially followed by neurology at [facility] clinic for FTD, referred for cognitive-linguistic evaluation by Sharene Butters, PA-C. PMHx also includes hypertension, hyperlipidemia, glaucoma, depression, 1 cm L frontal meningioma, migraine headaches, OSA on CPAP. MRI showing R temporal, R frontal, Left Frontal atrophy. MoCA on 05/01/21 with "deficiencies in most areas, especially in visuospatial  executive 1 out of 5, naming, attention, delayed recall 3/5, orientation 0/5."   ?  ? Cognitive-Linquistic Treatment  ? Treatment focused on Cognition;Patient/family/caregiver education   ? Skilled Treatment Pt continues to use his daily checklist and calendar; pt recalled previous and upcoming activities with min cue for weekend task (goathouse cleaning). Pt recorded upcoming activities on his calendar with min A to locate correct date. Demonstrated cognitive home activities with memory and attention games apps. Unscrambling sentences with occasional mod cues, 90% acc for 3 words, 70% acc for 4 words.   ?  ? Assessment / Recommendations / Plan  ? Plan Continue with current plan of care   ?  ? Progression Toward Goals  ? Progression toward goals Progressing toward goals   ? ?  ?  ? ?  ? ? ? SLP Education - 05/24/21 1742   ? ? Education Details slow down, read to self   ? Person(s) Educated Patient   ? Methods Explanation   ? Comprehension Verbalized understanding   ? ?  ?  ? ?  ? ? ? SLP Short Term Goals - 05/09/21 1215   ? ?  ? SLP SHORT TERM GOAL #1  ? Title Pt will establish external aid for functional recall  and bring to >75% of therapy sessions.   ? Time 6   ? Period Weeks   ? Status New   ? Target Date 06/20/21   ?  ? SLP SHORT TERM GOAL #2  ? Title Patient will generate sentence responses to simple questions >90% accuracy with mod cues.   ? Time 6   ? Period Weeks   ? Status New   ? Target Date 06/20/21   ?  ? SLP SHORT TERM GOAL #3  ? Title Pt will engage in simple conversation 3-5 minutes with supported conversation strategies or visual aid if necessary.   ? Time 6   ? Period Weeks   ? Status New   ? Target Date 06/20/21   ? ?  ?  ? ?  ? ? ? SLP Long Term Goals - 05/09/21 1225   ? ?  ? SLP LONG TERM GOAL #1  ? Title Pt will use external aid for functional recall of completed/upcoming activities 80% accuracy with rare min A.   ? Time 12   ? Period Weeks   ? Status New   ? Target Date 08/06/21   ?  ? SLP LONG  TERM GOAL #2  ? Title Patient will engage in simple-mod complex conversation for 5-8 minutes about a topic of interest, with supported conversation or use of visual aids if needed.   ? Time 12   ? Period Weeks   ? Status New   ? Target Date 08/06/21   ?  ? SLP LONG TERM GOAL #3  ? Title Patient will demonstrate knowledge of appropriate activities to support language function outside of ST with assistance from family.   ? Time 12   ? Period Weeks   ? Status New   ? Target Date 08/06/21   ? ?  ?  ? ?  ? ? ? Plan - 05/24/21 1743   ? ? Clinical Impression Statement Adreon Wykes is a pleasant 51 y.o.  gentleman with moderate-severe cognitive communication and moderate language impairments. Continue to focus on education for pt and family on importance of setting up routines and structure to allow pt to maintain safety and independence for as long as possible, and on training in cognitive home program. Family support will be crucial for pt to provide consistency/reminders and promote carryover of trained therapy strategies; patient appears to have strong family support and has been consistent with routine/schedule use since introduced. I recommend course of skilled ST focused on compensation and strategy training to increase safety, prolong independence and improve social interactions and quality of life. Will consider use of AAC and/or external aids to improve pt's ability to communicate with family.   ? Speech Therapy Frequency 2x / week   ? Duration 12 weeks   estimate only 8 weeks necessary unless pt shows potential with alternative communication device  ? Treatment/Interventions Language facilitation;Environmental controls;SLP instruction and feedback;Compensatory techniques;Functional tasks;Compensatory strategies;Internal/external aids;Multimodal communcation approach;Patient/family education   ? Potential to Achieve Goals Good   -fair  ? Potential Considerations Ability to learn/carryover information;Previous  level of function;Severity of impairments   ? ?  ?  ? ?  ? ? ?Patient will benefit from skilled therapeutic intervention in order to improve the following deficits and impairments:   ?Cognitive communication deficit ? ?Aphasia ? ? ? ?Problem List ?Patient Active Problem List  ? Diagnosis Date Noted  ? Cerebral atrophy, mild ([place]) 12/14/2018  ? OSA (obstructive sleep apnea) 05/20/2018  ? Benign essential hypertension  06/05/2015  ? Hyperlipidemia, mixed 06/05/2015  ? Nephrolithiasis 05/20/2013  ? ?Deneise Lever, MS, CCC-SLP ?Speech-Language Pathologist ?[phone] ? ?Aliene Altes, CCC-SLP ?05/24/2021, 5:45 PM ? ?[place] ?[facility] ?[place], [place], [address] ?Phone: [phone]   Fax:  [phone] ? ? ?Name: KIRKLIN BARNO ?MRN: [id number] ?Date of Birth: 1970/03/20 ? ?

## 2021-05-29 ENCOUNTER — Ambulatory Visit: Payer: Medicare HMO | Admitting: Speech Pathology

## 2021-05-29 DIAGNOSIS — R41841 Cognitive communication deficit: Secondary | ICD-10-CM | POA: Diagnosis not present

## 2021-05-29 DIAGNOSIS — R4701 Aphasia: Secondary | ICD-10-CM

## 2021-05-29 NOTE — Therapy (Signed)
[place] ?[facility] ?[address] ?[place], [place], [address] ?Phone: [phone]   Fax:  [phone] ? ?Speech Language Pathology Treatment ? ?Patient Details  ?Name: Chad Stone ?MRN: [id number] ?Date of Birth: 10/13/1970 ?Referring Provider (SLP): Marlowe KaysSara Wertman, PA-C ? ? ?Encounter Date: 05/29/2021 ? ? End of Session - 05/29/21 1726   ? ? Visit Number 5   ? Number of Visits 25   ? Date for SLP Re-Evaluation 08/07/21   ? Authorization Type Aetna Medicare   ? Authorization - Visit Number 5   ? Progress Note Due on Visit 10   ? SLP Start Time 1500   ? SLP Stop Time  1600   ? SLP Time Calculation (min) 60 min   ? Activity Tolerance Patient tolerated treatment well   ? ?  ?  ? ?  ? ? ?Past Medical History:  ?Diagnosis Date  ? Benign essential HTN   ? Cerebral atrophy, mild ([facility])   ? Fronto-temporal dementia ([facility])   ? Hyperlipidemia   ? Nephrolithiasis   ? OSA (obstructive sleep apnea)   ? ? ?Past Surgical History:  ?Procedure Laterality Date  ? LITHOTRIPSY    ? ? ?There were no vitals filed for this visit. ? ? Subjective Assessment - 05/29/21 1720   ? ? Subjective Greets SLP with smile   ? Patient is accompained by: --   family friend  ? Currently in Pain? No/denies   ? ?  ?  ? ?  ? ? ? ? ? ? ? ? ADULT SLP TREATMENT - 05/29/21 1720   ? ?  ? General Information  ? Behavior/Cognition Alert;Cooperative;Pleasant mood   ? HPI Chad Stone is a 51 y.o. year old RH male with 3 year history of memory loss, initially followed by neurology at [facility] for FTD, referred for cognitive-linguistic evaluation by Marlowe KaysSara Wertman, PA-C. PMHx also includes hypertension, hyperlipidemia, glaucoma, depression, 1 cm L frontal meningioma, migraine headaches, OSA on CPAP. MRI showing R temporal, R frontal, Left Frontal atrophy. MoCA on 05/01/21 with "deficiencies in most areas, especially in visuospatial executive 1 out of 5, naming, attention, delayed recall 3/5, orientation  0/5."   ?  ? Cognitive-Linquistic Treatment  ? Treatment focused on Cognition;Patient/family/caregiver education   ? Skilled Treatment Pt?s calendar and schedule checklist continue to be up to date. Pt recalled previous and upcoming activities with mod I using calendar. Pt added upcoming activity of church without prompting. Accompanying friend reminded pt of a meeting at church on Saturday, which he added to his calendar with min cue to include time. Continues to write vertically at times.  Pt brought 3-ring binder which SLP assisted with setting up to organize schedule and therapy materials. Targeted memory and attention strategy training as pt accessed cognitive games on his tablet. Pt used strategies of verbalizing/repeating information, systematic scanning with usual mod-max cues.   ?  ? Assessment / Recommendations / Plan  ? Plan Continue with current plan of care   ?  ? Progression Toward Goals  ? Progression toward goals Not progressing toward goals (comment)   ? ?  ?  ? ?  ? ? ? SLP Education - 05/29/21 1725   ? ? Education Details WARM strategies   ? Person(s) Educated Patient   ? Methods Explanation;Handout   ? Comprehension Verbalized understanding;Verbal cues required;Need further instruction   ? ?  ?  ? ?  ? ? ? SLP Short Term Goals -  05/09/21 1215   ? ?  ? SLP SHORT TERM GOAL #1  ? Title Pt will establish external aid for functional recall and bring to >75% of therapy sessions.   ? Time 6   ? Period Weeks   ? Status New   ? Target Date 06/20/21   ?  ? SLP SHORT TERM GOAL #2  ? Title Patient will generate sentence responses to simple questions >90% accuracy with mod cues.   ? Time 6   ? Period Weeks   ? Status New   ? Target Date 06/20/21   ?  ? SLP SHORT TERM GOAL #3  ? Title Pt will engage in simple conversation 3-5 minutes with supported conversation strategies or visual aid if necessary.   ? Time 6   ? Period Weeks   ? Status New   ? Target Date 06/20/21   ? ?  ?  ? ?  ? ? ? SLP Long Term Goals -  05/09/21 1225   ? ?  ? SLP LONG TERM GOAL #1  ? Title Pt will use external aid for functional recall of completed/upcoming activities 80% accuracy with rare min A.   ? Time 12   ? Period Weeks   ? Status New   ? Target Date 08/06/21   ?  ? SLP LONG TERM GOAL #2  ? Title Patient will engage in simple-mod complex conversation for 5-8 minutes about a topic of interest, with supported conversation or use of visual aids if needed.   ? Time 12   ? Period Weeks   ? Status New   ? Target Date 08/06/21   ?  ? SLP LONG TERM GOAL #3  ? Title Patient will demonstrate knowledge of appropriate activities to support language function outside of ST with assistance from family.   ? Time 12   ? Period Weeks   ? Status New   ? Target Date 08/06/21   ? ?  ?  ? ?  ? ? ? Plan - 05/29/21 1726   ? ? Clinical Impression Statement Chad Stone is a pleasant 51 y.o. gentleman with moderate-severe cognitive communication and moderate language impairments. Continue to focus on education for pt and family on importance of setting up routines and structure to allow pt to maintain safety and independence for as long as possible, and on training in cognitive home program. Family support will be crucial for pt to provide consistency/reminders and promote carryover of trained therapy strategies; patient appears to have strong family support and has been consistent with routine/schedule use since introduced. I recommend course of skilled ST focused on compensation and strategy training to increase safety, prolong independence and improve social interactions and quality of life. Will consider use of AAC and/or external aids to improve pt's ability to communicate with family.   ? Speech Therapy Frequency 2x / week   ? Duration 12 weeks   estimate only 8 weeks necessary unless pt shows potential with alternative communication device  ? Treatment/Interventions Language facilitation;Environmental controls;SLP instruction and feedback;Compensatory  techniques;Functional tasks;Compensatory strategies;Internal/external aids;Multimodal communcation approach;Patient/family education   ? Potential to Achieve Goals Good   -fair  ? Potential Considerations Ability to learn/carryover information;Previous level of function;Severity of impairments   ? ?  ?  ? ?  ? ? ?Patient will benefit from skilled therapeutic intervention in order to improve the following deficits and impairments:   ?Cognitive communication deficit ? ?Aphasia ? ? ? ?Problem List ?Patient Active Problem List  ?  Diagnosis Date Noted  ? Cerebral atrophy, mild ([facility]) 12/14/2018  ? OSA (obstructive sleep apnea) 05/20/2018  ? Benign essential hypertension 06/05/2015  ? Hyperlipidemia, mixed 06/05/2015  ? Nephrolithiasis 05/20/2013  ? ?Rondel Baton, MS, CCC-SLP ?Speech-Language Pathologist ?[phone] ? ?Arlana Lindau, CCC-SLP ?05/29/2021, 5:27 PM ? ?[place] ?[facility] ?[address] ?[place], [place], [address] ?Phone: [phone]   Fax:  [phone] ? ? ?Name: KEVYN BOQUET ?MRN: [id number] ?Date of Birth: 11/29/1970 ? ?

## 2021-05-29 NOTE — Patient Instructions (Signed)
?  Memory Strategies ? ?W - Write it down ? ?A - Associate it with something ? ?R - Repeat it out loud ? ?M - Mental Image  ? ?Try to spend 30 minutes every day doing a brain activity. Here are some ideas: ? ?-Memory Games and Train your brain on your tablet ? - Solitaire ? - Scrabble ? - Chess/Checkers ? - Crosswords (easy level) ? Juanna Cao ? - Card Games ? - Board Games ? - Connect 4 ? - Simon ? - the Memory Game ? - Dominoes ? - Jig Saw puzzles ? ? ? ? ? ? ?  ?

## 2021-05-31 ENCOUNTER — Ambulatory Visit: Payer: Medicare HMO | Admitting: Speech Pathology

## 2021-05-31 DIAGNOSIS — R41841 Cognitive communication deficit: Secondary | ICD-10-CM | POA: Diagnosis not present

## 2021-05-31 DIAGNOSIS — R4701 Aphasia: Secondary | ICD-10-CM

## 2021-06-01 NOTE — Therapy (Signed)
[place] [facility] [address] [place], [place], [address] Phone: [phone]   Fax:  [phone]  Speech Language Pathology Treatment  Patient Details  Name: Chad Stone MRN: [id number] Date of Birth: March 29, 1970 Referring Provider (SLP): Marlowe Kays, PA-C   Encounter Date: 05/31/2021   End of Session - 06/01/21 0746     Visit Number 6    Number of Visits 25    Date for SLP Re-Evaluation 08/07/21    Authorization Type Aetna Medicare    Authorization - Visit Number 6    Progress Note Due on Visit 10    SLP Start Time 1500    SLP Stop Time  1600    SLP Time Calculation (min) 60 min    Activity Tolerance Patient tolerated treatment well             Past Medical History:  Diagnosis Date   Benign essential HTN    Cerebral atrophy, mild (H[facility])    Fronto-temporal dementia ([facility])    Hyperlipidemia    Nephrolithiasis    OSA (obstructive sleep apnea)     Past Surgical History:  Procedure Laterality Date   LITHOTRIPSY      There were no vitals filed for this visit.   Subjective Assessment - 06/01/21 0745     Subjective Arrives with memory binder    Patient is accompained by: --   mother   Currently in Pain? Yes    Pain Score 5     Pain Location Head                   ADULT SLP TREATMENT - 06/01/21 0745       General Information   Behavior/Cognition Alert;Cooperative;Pleasant mood    HPI [name] is a 51 y.o. year old RH male with 3 year history of memory loss, initially followed by neurology at [facility] clinic for FTD, referred for cognitive-linguistic evaluation by Marlowe Kays, PA-C. PMHx also includes hypertension, hyperlipidemia, glaucoma, depression, 1 cm L frontal meningioma, migraine headaches, OSA on CPAP. MRI showing R temporal, R frontal, Left Frontal atrophy. MoCA on 05/01/21 with "deficiencies in most areas, especially in visuospatial executive 1 out of 5, naming,  attention, delayed recall 3/5, orientation 0/5."      Cognitive-Linquistic Treatment   Treatment focused on Cognition;Patient/family/caregiver education    Skilled Treatment Skilled ST session focused on creating external aids to increase pt's participation in household chores. With moderate cues, pt ID'd days to set up routine chores (sweeping, cleaning bathroom) and wrote these on his calendar for the upcoming week. Pt reports he does not have bathroom cleaning supplies (mother stated pt cannot remember where they are) and with max cues was unable to sequence steps to cleaning his bathroom. Generated visual checklist with images for bathroom cleaning for pt's memory book. Pt homework is to use his checklist to clean the bathroom on assigned day.      Assessment / Recommendations / Plan   Plan Continue with current plan of care      Progression Toward Goals   Progression toward goals Progressing toward goals              SLP Education - 06/01/21 0746     Education Details memory book, use of checklists    Person(s) Educated Patient;Parent(s)    Methods Explanation;Demonstration    Comprehension Verbalized understanding              SLP Short  Term Goals - 05/09/21 1215       SLP SHORT TERM GOAL #1   Title Pt will establish external aid for functional recall and bring to >75% of therapy sessions.    Time 6    Period Weeks    Status New    Target Date 06/20/21      SLP SHORT TERM GOAL #2   Title Patient will generate sentence responses to simple questions >90% accuracy with mod cues.    Time 6    Period Weeks    Status New    Target Date 06/20/21      SLP SHORT TERM GOAL #3   Title Pt will engage in simple conversation 3-5 minutes with supported conversation strategies or visual aid if necessary.    Time 6    Period Weeks    Status New    Target Date 06/20/21              SLP Long Term Goals - 05/09/21 1225       SLP LONG TERM GOAL #1   Title Pt will use  external aid for functional recall of completed/upcoming activities 80% accuracy with rare min A.    Time 12    Period Weeks    Status New    Target Date 08/06/21      SLP LONG TERM GOAL #2   Title Patient will engage in simple-mod complex conversation for 5-8 minutes about a topic of interest, with supported conversation or use of visual aids if needed.    Time 12    Period Weeks    Status New    Target Date 08/06/21      SLP LONG TERM GOAL #3   Title Patient will demonstrate knowledge of appropriate activities to support language function outside of ST with assistance from family.    Time 12    Period Weeks    Status New    Target Date 08/06/21              Plan - 06/01/21 0746     Clinical Impression Statement Rae RoamChristopher Cloer is a pleasant 51 y.o. gentleman with moderate-severe cognitive communication and moderate language impairments. Continue to focus on education for pt and family on importance of setting up routines and structure to allow pt to maintain safety and independence for as long as possible, and on training in cognitive home program. Family support will be crucial for pt to provide consistency/reminders and promote carryover of trained therapy strategies; patient appears to have strong family support and has been consistent with routine/schedule use since introduced. I recommend course of skilled ST focused on compensation and strategy training to increase safety, prolong independence and improve social interactions and quality of life. Will consider use of AAC and/or external aids to improve pt's ability to communicate with family.    Speech Therapy Frequency 2x / week    Duration 12 weeks   estimate only 8 weeks necessary unless pt shows potential with alternative communication device   Treatment/Interventions Language facilitation;Environmental controls;SLP instruction and feedback;Compensatory techniques;Functional tasks;Compensatory strategies;Internal/external  aids;Multimodal communcation approach;Patient/family education    Potential to Achieve Goals Good   -fair   Potential Considerations Ability to learn/carryover information;Previous level of function;Severity of impairments             Patient will benefit from skilled therapeutic intervention in order to improve the following deficits and impairments:   Cognitive communication deficit  Aphasia    Problem List Patient  Active Problem List   Diagnosis Date Noted   Cerebral atrophy, mild (HCC) 12/14/2018   OSA (obstructive sleep apnea) 05/20/2018   Benign essential hypertension 06/05/2015   Hyperlipidemia, mixed 06/05/2015   Nephrolithiasis 05/20/2013   Rondel Baton, MS, CCC-SLP Speech-Language Pathologist [phone]   Arlana Lindau, CCC-SLP 06/01/2021, 7:47 AM  [place] [facility] [address] [place], [place], [address] Phone: [phone]   Fax:  [phone]   Name: LEAH SKORA MRN: [id number] Date of Birth: 01-04-1971

## 2021-06-04 ENCOUNTER — Ambulatory Visit: Payer: Medicare HMO | Admitting: Speech Pathology

## 2021-06-04 DIAGNOSIS — R4701 Aphasia: Secondary | ICD-10-CM

## 2021-06-04 DIAGNOSIS — R41841 Cognitive communication deficit: Secondary | ICD-10-CM

## 2021-06-04 NOTE — Patient Instructions (Signed)
Homework:  Look through some photos or on Facebook and see if you can find a couple to put in your memory book. Some ideas (you don't have to find everything!)   Scouting: [employer], Teacher, English as a foreign language, your friend Lauretta Chester  Church: Photos of you and church friends or deacons  Pets: your goats, Programmer, systems, childhood pets   Work: or friends from work  Family: Your sister, mom and and dad, grandparents (Nanny, Poppa, [place], [place])

## 2021-06-05 ENCOUNTER — Ambulatory Visit: Payer: Medicare HMO | Admitting: Speech Pathology

## 2021-06-05 DIAGNOSIS — R41841 Cognitive communication deficit: Secondary | ICD-10-CM | POA: Diagnosis not present

## 2021-06-05 DIAGNOSIS — R4701 Aphasia: Secondary | ICD-10-CM

## 2021-06-05 NOTE — Therapy (Signed)
[facility] [address] [place], [place], [address] Phone: [phone]   Fax:  [phone]  Speech Language Pathology Treatment  Patient Details  Name: Chad GowdaChristopher B Stone MRN: [id number] Date of Birth: 01/11/1971 Referring Provider (SLP): Marlowe KaysSara Wertman, PA-C   Encounter Date: 06/04/2021   End of Session - 06/05/21 0941     Visit Number 7    Number of Visits 25    Date for SLP Re-Evaluation 08/07/21    Authorization Type [employer]    Authorization - Visit Number 7    Progress Note Due on Visit 10    SLP Start Time 1600    SLP Stop Time  1700    SLP Time Calculation (min) 60 min    Activity Tolerance Patient tolerated treatment well             Past Medical History:  Diagnosis Date   Benign essential HTN    Cerebral atrophy, mild (HCC)    Fronto-temporal dementia (HCC)    Hyperlipidemia    Nephrolithiasis    OSA (obstructive sleep apnea)     Past Surgical History:  Procedure Laterality Date   LITHOTRIPSY      There were no vitals filed for this visit.   Subjective Assessment - 06/05/21 0937     Subjective Pt reports cleaning his bathroom    Patient is accompained by: Family member   mother   Currently in Pain? No/denies                   ADULT SLP TREATMENT - 06/05/21 0938       General Information   Behavior/Cognition Alert;Cooperative;Pleasant mood    HPI Chad GowdaChristopher B Stone is a 51 y.o. year old RH male with 3 year history of memory loss, [facility], referred for cognitive-linguistic evaluation by Marlowe KaysSara Wertman, PA-C. PMHx also includes hypertension, hyperlipidemia, glaucoma, depression, 1 cm L frontal meningioma, migraine headaches, OSA on CPAP. MRI showing R temporal, R frontal, Left Frontal atrophy. MoCA on 05/01/21 with "deficiencies in most areas, especially in visuospatial executive 1 out of 5, naming, attention, delayed recall  3/5, orientation 0/5."      Cognitive-Linquistic Treatment   Treatment focused on Cognition;Patient/family/caregiver education    Skilled Treatment Pt cleaned bathroom but did not use checklist. Calendar up to date. Focused session on expressive language goals, focused on sentence responses to questions re: personal details. Pt looked to mother or responded "I'm not sure," ~50% of the time. When written cues, question cues, supported conversation strategies used, pt able to provide additional information 80% of the time. Discussed using memory book to assist with conversation and recall; encouraged pt, mother to review photos at home and bring to session to add to visual aid in pt's memory binder.      Assessment / Recommendations / Plan   Plan Continue with current plan of care      Progression Toward Goals   Progression toward goals Progressing toward goals              SLP Education - 06/05/21 0941     Education Details visual aids to support conversation    Person(s) Educated Patient;Parent(s)    Methods Explanation;Demonstration    Comprehension Verbalized understanding;Verbal cues required              SLP Short Term Goals - 05/09/21 1215       SLP SHORT TERM  GOAL #1   Title Pt will establish external aid for functional recall and bring to >75% of therapy sessions.    Time 6    Period Weeks    Status New    Target Date 06/20/21      SLP SHORT TERM GOAL #2   Title Patient will generate sentence responses to simple questions >90% accuracy with mod cues.    Time 6    Period Weeks    Status New    Target Date 06/20/21      SLP SHORT TERM GOAL #3   Title Pt will engage in simple conversation 3-5 minutes with supported conversation strategies or visual aid if necessary.    Time 6    Period Weeks    Status New    Target Date 06/20/21              SLP Long Term Goals - 05/09/21 1225       SLP LONG TERM GOAL #1   Title Pt will use external aid for  functional recall of completed/upcoming activities 80% accuracy with rare min A.    Time 12    Period Weeks    Status New    Target Date 08/06/21      SLP LONG TERM GOAL #2   Title Patient will engage in simple-mod complex conversation for 5-8 minutes about a topic of interest, with supported conversation or use of visual aids if needed.    Time 12    Period Weeks    Status New    Target Date 08/06/21      SLP LONG TERM GOAL #3   Title Patient will demonstrate knowledge of appropriate activities to support language function outside of ST with assistance from family.    Time 12    Period Weeks    Status New    Target Date 08/06/21              Plan - 06/05/21 0941     Clinical Impression Statement [name] is a pleasant 51 y.o. gentleman with moderate-severe cognitive communication and moderate language impairments. Continue to focus on education for pt and family on importance of setting up routines and structure to allow pt to maintain safety and independence for as long as possible, and on training in cognitive home program. Patient responds well to supported conversation strategies and use of visual aids to facilitate language output. Family support will be crucial for pt to provide consistency/reminders and promote carryover of trained therapy strategies; patient appears to have strong family support and has been consistent with routine/schedule use since introduced. I recommend course of skilled ST focused on compensation and strategy training to increase safety, prolong independence and improve social interactions and quality of life. Will consider use of AAC and/or external aids to improve pt's ability to communicate with family.    Speech Therapy Frequency 2x / week    Duration 12 weeks   estimate only 8 weeks necessary unless pt shows potential with alternative communication device   Treatment/Interventions Language facilitation;Environmental controls;SLP instruction  and feedback;Compensatory techniques;Functional tasks;Compensatory strategies;Internal/external aids;Multimodal communcation approach;Patient/family education    Potential to Achieve Goals Good   -fair   Potential Considerations Ability to learn/carryover information;Previous level of function;Severity of impairments             Patient will benefit from skilled therapeutic intervention in order to improve the following deficits and impairments:   Cognitive communication deficit  Aphasia    Problem  List Patient Active Problem List   Diagnosis Date Noted   Cerebral atrophy, mild (HCC) 12/14/2018   OSA (obstructive sleep apnea) 05/20/2018   Benign essential hypertension 06/05/2015   Hyperlipidemia, mixed 06/05/2015   Nephrolithiasis 05/20/2013   Rondel Baton, MS, CCC-SLP Speech-Language Pathologist [phone]   Arlana Lindau, CCC-SLP 06/05/2021, 9:43 AM  [facility] MAIN [facility] SERVICES [address] [place], [place], [address] Phone: [phone]   Fax:  [phone]   Name: CEDARIUS KERSH MRN: [id number] Date of Birth: 1970/02/28

## 2021-06-05 NOTE — Therapy (Signed)
[place] MAIN [facility] SERVICES [address] [place], [place], [address] Phone: [phone]   Fax:  [phone]  Speech Language Pathology Treatment  Patient Details  Name: Chad Stone MRN: [id number] Date of Birth: 1970-10-05 Referring Provider (SLP): Sharene Butters, PA-C   Encounter Date: 06/05/2021   End of Session - 06/05/21 1729     Visit Number 8    Number of Visits 25    Date for SLP Re-Evaluation 08/07/21    Authorization Type [employer]    Authorization - Visit Number 8    Progress Note Due on Visit 10    SLP Start Time 1500    SLP Stop Time  1600    SLP Time Calculation (min) 60 min    Activity Tolerance Patient tolerated treatment well             Past Medical History:  Diagnosis Date   Benign essential HTN    Cerebral atrophy, mild (HCC)    Fronto-temporal dementia ([place])    Hyperlipidemia    Nephrolithiasis    OSA (obstructive sleep apnea)     Past Surgical History:  Procedure Laterality Date   LITHOTRIPSY      There were no vitals filed for this visit.   Subjective Assessment - 06/05/21 1511     Subjective Forgot binder today.    Currently in Pain? No/denies                   ADULT SLP TREATMENT - 06/05/21 1724       General Information   Behavior/Cognition Alert;Cooperative;Pleasant mood    HPI Chad Stone is a 51 y.o. year old RH male with 3 year history of memory loss, initially followed by neurology at [facility] for FTD, referred for cognitive-linguistic evaluation by Sharene Butters, PA-C. PMHx also includes hypertension, hyperlipidemia, glaucoma, depression, 1 cm L frontal meningioma, migraine headaches, OSA on CPAP. MRI showing R temporal, R frontal, Left Frontal atrophy. MoCA on 05/01/21 with "deficiencies in most areas, especially in visuospatial executive 1 out of 5, naming, attention, delayed recall 3/5, orientation 0/5."      Cognitive-Linquistic Treatment    Treatment focused on Cognition;Patient/family/caregiver education    Skilled Treatment Pt generated sentences for memory book with use of visuals (images) and moderate cues from SLP. Frequent hesitation, wordfinding difficulties. When reviewing pages from memory book, pt answered personal questions with fluent, cohesive sentences re: his church, work, and likes/interests (music).      Assessment / Recommendations / Plan   Plan Continue with current plan of care      Progression Toward Goals   Progression toward goals Progressing toward goals              SLP Education - 06/05/21 1729     Education Details memory book content    Person(s) Educated Patient;Parent(s)    Methods Explanation    Comprehension Verbalized understanding              SLP Short Term Goals - 05/09/21 1215       SLP SHORT TERM GOAL #1   Title Pt will establish external aid for functional recall and bring to >75% of therapy sessions.    Time 6    Period Weeks    Status New    Target Date 06/20/21      SLP SHORT TERM GOAL #2   Title Patient will generate sentence responses to simple questions >90% accuracy with mod  cues.    Time 6    Period Weeks    Status New    Target Date 06/20/21      SLP SHORT TERM GOAL #3   Title Pt will engage in simple conversation 3-5 minutes with supported conversation strategies or visual aid if necessary.    Time 6    Period Weeks    Status New    Target Date 06/20/21              SLP Long Term Goals - 05/09/21 1225       SLP LONG TERM GOAL #1   Title Pt will use external aid for functional recall of completed/upcoming activities 80% accuracy with rare min A.    Time 12    Period Weeks    Status New    Target Date 08/06/21      SLP LONG TERM GOAL #2   Title Patient will engage in simple-mod complex conversation for 5-8 minutes about a topic of interest, with supported conversation or use of visual aids if needed.    Time 12    Period Weeks     Status New    Target Date 08/06/21      SLP LONG TERM GOAL #3   Title Patient will demonstrate knowledge of appropriate activities to support language function outside of ST with assistance from family.    Time 12    Period Weeks    Status New    Target Date 08/06/21              Plan - 06/05/21 1730     Clinical Impression Statement Fabiano Thiry is a pleasant 51 y.o. gentleman with moderate-severe cognitive communication and moderate language impairments. Continue to focus on education for pt and family on importance of setting up routines and structure to allow pt to maintain safety and independence for as long as possible, and on training in cognitive home program. Patient responds well to supported conversation strategies and use of visual aids to facilitate language output. Family support will be crucial for pt to provide consistency/reminders and promote carryover of trained therapy strategies; patient appears to have strong family support and has been consistent with routine/schedule use since introduced. I recommend course of skilled ST focused on compensation and strategy training to increase safety, prolong independence and improve social interactions and quality of life. Will consider use of AAC and/or external aids to improve pt's ability to communicate with family.    Speech Therapy Frequency 2x / week    Duration 12 weeks   estimate only 8 weeks necessary unless pt shows potential with alternative communication device   Treatment/Interventions Language facilitation;Environmental controls;SLP instruction and feedback;Compensatory techniques;Functional tasks;Compensatory strategies;Internal/external aids;Multimodal communcation approach;Patient/family education    Potential to Achieve Goals Good   -fair   Potential Considerations Ability to learn/carryover information;Previous level of function;Severity of impairments             Patient will benefit from skilled  therapeutic intervention in order to improve the following deficits and impairments:   Cognitive communication deficit  Aphasia    Problem List Patient Active Problem List   Diagnosis Date Noted   Cerebral atrophy, mild ([place]) 12/14/2018   OSA (obstructive sleep apnea) 05/20/2018   Benign essential hypertension 06/05/2015   Hyperlipidemia, mixed 06/05/2015   Nephrolithiasis 05/20/2013   Deneise Lever, MS, CCC-SLP Speech-Language Pathologist [phone]  Aliene Altes, [place] 06/05/2021, 5:31 PM  [place] [facility]  SERVICES [place], [place], [address] Phone: [phone]   Fax:  [phone]   Name: FINNIS SWALLEY MRN: [id number] Date of Birth: [birth year]

## 2021-06-06 ENCOUNTER — Encounter: Payer: Self-pay | Admitting: Psychology

## 2021-06-07 ENCOUNTER — Ambulatory Visit (INDEPENDENT_AMBULATORY_CARE_PROVIDER_SITE_OTHER): Payer: Medicare HMO | Admitting: Psychology

## 2021-06-07 ENCOUNTER — Ambulatory Visit: Payer: Medicare HMO

## 2021-06-07 ENCOUNTER — Encounter: Payer: Medicare HMO | Admitting: Speech Pathology

## 2021-06-07 ENCOUNTER — Encounter: Payer: Self-pay | Admitting: Psychology

## 2021-06-07 DIAGNOSIS — G3109 Other frontotemporal dementia: Secondary | ICD-10-CM

## 2021-06-07 DIAGNOSIS — F0280 Dementia in other diseases classified elsewhere without behavioral disturbance: Secondary | ICD-10-CM | POA: Diagnosis not present

## 2021-06-07 DIAGNOSIS — G3101 Pick's disease: Secondary | ICD-10-CM

## 2021-06-07 DIAGNOSIS — R4189 Other symptoms and signs involving cognitive functions and awareness: Secondary | ICD-10-CM

## 2021-06-07 HISTORY — DX: Dementia in other diseases classified elsewhere, unspecified severity, without behavioral disturbance, psychotic disturbance, mood disturbance, and anxiety: F02.80

## 2021-06-07 NOTE — Progress Notes (Signed)
   Psychometrician Note   Cognitive testing was administered to Chad Stone by Shan Levans, B.S. (psychometrist) under the supervision of Dr. Newman Nickels, Ph.D., licensed psychologist on 06/07/2021. Chad Stone did not appear overtly distressed by the testing session per behavioral observation or responses across self-report questionnaires. Rest breaks were offered.    The battery of tests administered was selected by Dr. Newman Nickels, Ph.D. with consideration to Chad Stone current level of functioning, the nature of his symptoms, emotional and behavioral responses during interview, level of literacy, observed level of motivation/effort, and the nature of the referral question. This battery was communicated to the psychometrist. Communication between Dr. Newman Nickels, Ph.D. and the psychometrist was ongoing throughout the evaluation and Dr. Newman Nickels, Ph.D. was immediately accessible at all times. Dr. Newman Nickels, Ph.D. provided supervision to the psychometrist on the date of this service to the extent necessary to assure the quality of all services provided.    Chad Stone will return within approximately 1-2 weeks for an interactive feedback session with Dr. Milbert Stone at which time his test performances, clinical impressions, and treatment recommendations will be reviewed in detail. Chad Stone understands he can contact our office should he require our assistance before this time.  A total of 130 minutes of billable time were spent face-to-face with Chad Stone by the psychometrist. This includes both test administration and scoring time. Billing for these services is reflected in the clinical report generated by Dr. Newman Nickels, Ph.D.  This note reflects time spent with the psychometrician and does not include test scores or any clinical interpretations made by Dr. Milbert Stone. The full report will follow in a separate note.

## 2021-06-07 NOTE — Progress Notes (Signed)
NEUROPSYCHOLOGICAL EVALUATION [place]. [facility] Department of Neurology  Date of Evaluation: Jun 07, 2021  Reason for Referral:   Chad Stone is a 51 y.o. 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   Assessment and Plan:   Clinical Impression(s): Chad Stone pattern of performance is suggestive of diffuse cognitive impairment. He was able to learn and later recall story-based information reasonably well. Performance variability was also exhibited across basic attention. However, severe impairment was exhibited across all other tasks/domains. This includes processing speed, complex attention, executive functioning, receptive and expressive language, visuospatial abilities, and all performances across list and figure-based memory tasks. Fine motor speed was adequate bilaterally; however, severe impairment was also seen across tasks assessing more fine motor coordination and dexterity. Regarding ADLs, Chad Stone no longer drives and receives necessary assistance surrounding all aspects of medication, financial, and bill paying responsibilities. This, coupled with evidence for significant cognitive dysfunction described above, suggests that he meets criteria for a Major Neurocognitive Disorder ("dementia") at the present time.  The most likely etiology is the presence of frontotemporal lobar degeneration leading to a presentation of frontotemporal dementia (FTD). This is most strongly evidenced by neuroimaging, which has suggested advanced and progressive atrophy surrounding the frontal and temporal lobes. FTD is best described as an umbrella term which encompasses several different presentations. These are commonly  grouped into the behavioral variant and several language variants. Given that Chad Stone and his mother denied any concerns surrounding impulsivity, behavioral disinhibition, or severe personality changes, his presentation would not align with the behavioral variant of FTD. 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 However, comprehension difficulties were also quite significant, suggesting that he also may display features of the semantic dementia subtype. Recent neuroimaging seems to align better with former subtype relative to the latter. However, it certainly remains plausible that features of both are co-occurring. Chad Stone dementia presentation would appear to be fairly advanced given the extent of diffuse impairment. While it can be common to see language and executive functioning deficits in any FTD presentation, this will eventually spread to all areas of the brain as the disease progresses.   While I cannot rule out an additional co-occurring neurological process, his age, testing performances, and neuroimaging findings are more suggestive of FTD than Alzheimer's disease or a motor neuron disease at the present time. He does not display behavioral features concerning for Lewy body dementia or another more rare parkinsonian syndrome. A greater than three year timeframe (per medical records and past neuroimaging) would be slow for an evolving prion disease.  Recommendations: Chad Stone has already established a working relationship with a speech therapist and both he and his mother described these sessions as helpful with his overall degree of functioning. He is strongly encouraged to continue regular sessions as long  as he continues to see these benefits.  If his medical team has concerns for a more rapidly progressing dementia presentation (outside of the typical course for FTD/PPA presentations), a lumbar puncture could be helpful in ruling out additional etiologies.   Chad Stone  has already been prescribed a medication aimed to address cognitive decline and the presence of a neurodegenerative illness (i.e., donepezil/Aricept). He is encouraged to continue taking this medication as prescribed. It is important to highlight that this medication has been shown to slow functional decline in some individuals. There is no current treatment which can stop or reverse cognitive decline when caused by a neurodegenerative illness.   I agree with Chad Stone and his family's decision to have him fully abstain from all driving pursuits.   It will be important for Chad Stone to have another person with him when in situations where he may need to process information, weigh the pros and cons of different options, and make decisions, in order to ensure that he fully understands and recalls all information to be considered.  If not already done, Chad Stone and his family may want to discuss his wishes regarding durable power of attorney and medical decision making, so that he can have input into these choices. Additionally, they may wish to discuss future plans for caretaking and seek out community options for in home/residential care should they become necessary.  Chad Stone is encouraged to attend to lifestyle factors for brain health (e.g., regular physical exercise, good nutrition habits, regular participation in cognitively-stimulating activities, and general stress management techniques), which are likely to have benefits for both emotional adjustment and cognition. Optimal control of vascular risk factors (including safe cardiovascular exercise and adherence to dietary recommendations) is encouraged. Nightly compliance with  his CPAP machine will also be important given his history of sleep apnea. Continued participation in activities which provide mental stimulation and social interaction is also recommended.   Important information should be provided to Mr. Deyette in written format in all instances. This information should be placed in a highly frequented and easily visible location within his home to promote recall. External strategies such as written notes in a consistently used memory journal, visual and nonverbal auditory cues such as a calendar on the refrigerator or appointments with alarm, such as on a cell phone, can also help maximize recall.  Mr. Layden had difficulty with receptive language, particularly when presented in complex sentence structures. Presenting information it in short, concrete sentences will be helpful to improve comprehension.  Given deficits in expressive language, it will be difficult for him to provide lengthy responses to inquiries. However, he was able to produce short phrases with reasonable accuracy. Therefore, providing him with written multiple choice options when asking questions or asking closed questions which require only brief responses will maximize his ability to communicate effectively.  Review of Records:   Mr. Delsignore was seen by [facility] Chad Butters, PA-C) on 05/01/2021 for an evaluation of memory loss. At that time, cognitive decline was said to have been occurring for the past three years. He noted trouble with job performance, notably trouble following task instructions and holding tools properly. This ultimately led to work stoppage. Eventually, ADLs became impacted, requiring his sister to step in and provide assistance with medication and financial management. Cognitive difficulties were noteworthy for spoken language impairment and ongoing word finding difficulties. He also described trouble recalling names of places and people, especially during the past  year. Tremors and myoclonic jerking behaviors were denied. Paranoia, hallucinations, REM sleep behaviors, or prior psychiatric illness was also denied. There is a history of obstructive sleep apnea with what appears to be no current CPAP intervention. Performance on a brief cognitive screening instrument (MOCA) was 10/30. Ultimately, Mr. Priestly was  referred for a comprehensive neuropsychological evaluation to characterize his cognitive abilities and to assist with diagnostic clarity and treatment planning.   Brain MRI on 12/11/2018 revealed moderately age-advanced atrophy involving the frontal lobes (R > L), as well as a 1 cm left frontal meningioma. Brain MRI on 04/10/2021 suggested progression of frontal atrophy, as well as greater involvement in the right temporal lobe.   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 but also ongoing semantic impairment   Hyperlipidemia, mixed 06/05/2015   Nephrolithiasis    OSA (obstructive sleep apnea)     Past Surgical History:  Procedure Laterality Date   LITHOTRIPSY      Current Outpatient Medications:    bisoprolol (ZEBETA) 5 MG tablet, Take 1 tablet by mouth daily., Disp: , Rfl:    busPIRone (BUSPAR) 10 MG tablet, Take 1 tablet by mouth 2 (two) times daily., Disp: , Rfl:    diclofenac Sodium (VOLTAREN) 1 % GEL, Apply topically., Disp: , Rfl:    donepezil (ARICEPT) 10 MG tablet, Take 1 tablet by mouth at bedtime., Disp: , Rfl:    olmesartan (BENICAR) 40 MG tablet, Take by mouth., Disp: , Rfl:    SUMAtriptan (IMITREX) 100 MG tablet, Take by mouth., Disp: , Rfl:    albuterol (VENTOLIN HFA) 108 (90 Base) MCG/ACT inhaler, Inhale 1-2 puffs into the lungs every 6 (six) hours as needed (cough)., Disp: 6.7 g, Rfl: 10    amLODipine (NORVASC) 5 MG tablet, Take 5 mg by mouth daily., Disp: , Rfl:    amphetamine-dextroamphetamine (ADDERALL) 10 MG tablet, Take 10 mg by mouth daily with breakfast. , Disp: , Rfl:    fluticasone propionate (FLONASE) 41mcg/actuation nasal spray, place 2 sprays into both nostrils once daily   latanoprost (XALATAN) 0.005 % ophthalmic solution, Place 1 drop into both eyes at bedtime. , Disp: , Rfl:    Naproxen Sodium (ALEVE PO), Take 500 mg elemental calcium/kg/hr by mouth daily as needed., Disp: , Rfl:    TUMERIC oral, take by mouth   rosuvastatin (CRESTOR) 10 MG tablet, Take 1 tablet (10 mg total) by mouth daily., Disp: 90 tablet, Rfl: 3   venlafaxine XR (EFFEXOR-XR) 75 MG 24 hr capsule, Take 75 mg by mouth daily with breakfast. , Disp: , Rfl:   Clinical Interview:   The following information was obtained during a clinical interview with Mr. Ewing and his mother prior to cognitive testing.  Cognitive Symptoms: Decreased short-term memory: Endorsed. He primarily described trouble recalling details of past conversations or events. He generally denied trouble recalling names of familiar individuals or trouble misplacing things around his residence. His mother did not report him repeating himself frequently or asking repetitive questions.  Decreased long-term memory: Denied. Decreased attention/concentration: Denied. Reduced processing speed: Denied. Difficulties with executive functions: Endorsed. He acknowledged trouble with multi-tasking and organization. Both he and his mother denied increased impulsivity, any behavioral dysregulation, or any notable personality changes over recent years.  Difficulties with emotion regulation: Denied. Difficulties with receptive language: Denied. Difficulties with word finding: Endorsed. This represented both he and his mother's primary concern and most severe outward symptom. Speech was described as effortful, with significant word finding difficulties  being present. He acknowledged that he generally knows what he wants to say but has a  very hard time formulating the words and getting this information out. He is involved in outpatient speech therapy which was described as very helpful.   Decreased visuoperceptual ability: Denied.  Trajectory of deficits: During the current interview, expressive language and memory concerns were said to have been present for the past year or so and do appear to have progressively worsened over time. When meeting with Ms. Wertman during the previous month, cognitive concerns were said to be present for the past three years.   Difficulties completing ADLs: Endorsed. He continues to live alone. His sister lives nearby and provides necessary assistance with medication management, financial management, and bill paying responsibilities. He stopped driving a little over a year ago due to increasing cognitive difficulties.   Additional Medical History: History of traumatic brain injury/concussion: Denied. History of stroke: Denied. History of seizure activity: Endorsed. His mother noted that he experienced a febrile seizure when he was 73 months old. They reported no known persisting deficits and this represents the only experienced seizure throughout his life.  History of known exposure to toxins: Denied. Symptoms of chronic pain: Endorsed. He described primary difficulties with ongoing knee pain.  Experience of frequent headaches/migraines: Endorsed. He reported daily headaches which have been present for an extended period of time. These are generally treated well with Tylenol.   Sensory changes: He wears glasses with benefit. Other sensory changes/difficulties (e.g., hearing, taste, smell) were denied.  Balance/coordination difficulties: Denied. He also denied any recent falls.  Other motor difficulties: Denied. He and his mother reported no tremors, dyskinesia or other involuntary movements, or myoclonic jerking  behaviors. He denied any trouble with dexterity or using his hands for fine motor tasks.  Sleep History: Estimated hours obtained each night: 7-8 hours.  Difficulties falling asleep: Endorsed. He alluded to frequent headache symptoms which can sometimes compromise his ability to fall asleep quickly.  Difficulties staying asleep: Denied. Feels rested and refreshed upon awakening: Endorsed.  History of snoring: Endorsed. History of waking up gasping for air: Denied. Witnessed breath cessation while asleep: Denied. However, medical records do suggest a history of obstructive sleep apnea and that Mr. Biehl has not been using a CPAP device.   History of vivid dreaming: Denied. Excessive movement while asleep: Denied. Instances of acting out his dreams: Denied.  Psychiatric/Behavioral Health History: Depression: He was unable to articulate his current mood. When provided specific prompts (i.e., feeling sad, happy, frustrated), he stated that he was experiencing a mixture of these emotions. He denied to his knowledge a history of mental health concerns or formal diagnoses. Current or remote suicidal ideation, intent, or plan was denied.  Anxiety: Denied. Mania: Denied. Trauma History: Denied. Visual/auditory hallucinations: Denied. Delusional thoughts: Denied.  Tobacco: Denied. Alcohol: He denied current alcohol consumption as well as a history of problematic alcohol abuse or dependence.  Recreational drugs: Denied.  Family History: Problem Relation Age of Onset   Arrhythmia Father        cardiac ablation   Alzheimer's disease Maternal Grandmother    This information was confirmed by Mr. Orecchio.  Academic/Vocational History: Highest level of educational attainment: 12 years. He graduated from high school and described himself as a good (A/B) student in academic settings. No relative weaknesses were identified, with math noted as a strength.  History of developmental delay:  Denied. History of grade repetition: Denied. Enrollment in special education courses: Denied. History of LD/ADHD: Denied.  Employment: Unemployed. 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   Evaluation Results:   Behavioral Observations: Mr. Ricco was accompanied by his mother, arrived to his appointment on time, and was appropriately dressed and groomed. He appeared alert and oriented. Observed gait and station were within normal limits. Gross motor functioning appeared intact upon informal observation and no abnormal movements (e.g., tremors) were noted. His affect was generally relaxed and positive, but did range appropriately given the subject being discussed during the clinical interview or the task at hand during testing procedures. Spontaneous speech was quite effortful and notable word finding difficulties were observed during interview. He commonly responded with brief "yes" or "no" responses but could elaborate a bit further at times. No profound comprehension difficulties were noted during interview and he appeared generally able to understand questions asked of him. Thought processes appeared normal in content. Insight into his cognitive difficulties appeared somewhat adequate. However, he likely does not appreciate the full extent of ongoing impairment.   During testing, expressive language and word finding deficits continued to be noteworthy. He also exhibited far greater comprehension deficits, commonly requiring task instructions to be explained in a different manner to try and help promote a better understanding. Unfortunately, this was not always effective and several tasks were discontinued or dramatically affected due to these difficulties. He was noted to be extremely perseverative throughout the evaluation, often repeating the same response  or selection for multiple successive answers. This was witnessed across many different tasks and largely focused on numbers. He also appeared to forget instructions or lost his place while in the midst of attempting various tasks. Sustained attention was otherwise appropriate. Task engagement was adequate and he persisted when challenged to the best of his ability. Overall, Mr. Sahota was cooperative with the clinical interview and subsequent testing procedures.   Adequacy of Effort: The validity of neuropsychological testing is limited by the extent to which the individual being tested may be assumed to have exerted adequate effort during testing. Mr. Hannel expressed his intention to perform to the best of his abilities and exhibited adequate task engagement and persistence. Scores across stand-alone and embedded performance validity measures were variable but generally below expectation. However, I believe that this was the result of true and quite severe ongoing impairment rather than poor engagement or attempts to perform poorly. As such, the results of the current evaluation are believed to be a valid representation of Mr. Bundick current cognitive functioning.  Test Results: Mr. Leva was poorly oriented at the time of the current evaluation. He was unable to state his phone number. He was also unable to state the current year ("2022"), date, time, or name of the current clinic.   Intellectual abilities based upon educational and vocational attainment were estimated to be in the average range. Premorbid abilities were estimated to be within the below average range based upon a single-word reading test.   Processing speed was exceptionally low. Basic attention was variable, ranging from the well below average to below average normative ranges. More complex attention (e.g., working memory) was exceptionally low to well below average. Executive functioning was exceptionally low.  Assessed receptive  language abilities (i.e., reading, auditory, and semantic knowledge) were exceptionally low. Assessed expressive language (e.g., sentence repetitive, verbal fluency, written fluency, and confrontation naming) was exceptionally low to well below average. Writing samples were noteworthy for agrammatism (e.g., "Boy spill" or "hot dog ketchup").    Assessed visuospatial/visuoconstructional  abilities were exceptionally low. On his drawing of a clock, he was able to draw the outer circle. He included a total of three hash marks inside the clock face, placing the numbers 10, 1, and 2. Only the number 1 could be argued as being in a correct location. No clock hands were attempted. Points were lost on his copy of a complex figure largely due to the complete omission of many internal and external aspects of this figure.    Learning (i.e., encoding) of novel verbal information was exceptionally low across a list learning task but average across story-based content. Spontaneous delayed recall (i.e., retrieval) of previously learned information was largely commensurate with performances across learning trials. Retention rates were 86% across a story learning task, 0% across a list learning task, and 43% across a figure drawing task. Performance across recognition tasks was below expectation, suggesting limited evidence for information consolidation.  Performance on a task of simple motor speed (Finger Tapping) was below average when using his dominant (right) hand and average when using his non-dominant (left) hand. Performance on a task assessing fine motor coordination and dexterity was exceptionally low bilaterally. During this task, he was noted to lose his place along the pegboard, forget where to start the next row, and commonly skipped spots.    Results of emotional screening instruments suggested that recent symptoms of generalized anxiety were in the minimal range, while symptoms of depression were within the  moderate range. A screening instrument assessing recent sleep quality suggested the presence of minimal sleep dysfunction.  Tables of Scores:   Note: This summary of test scores accompanies the interpretive report and should not be considered in isolation without reference to the appropriate sections in the text. Descriptors are based on appropriate normative data and may be adjusted based on clinical judgment. Terms such as "Within Normal Limits" and "Outside Normal Limits" are used when a more specific description of the test score cannot be determined.       Percentile - Normative Descriptor > 98 - Exceptionally High 91-97 - Well Above Average 75-90 - Above Average 25-74 - Average 9-24 - Below Average 2-8 - Well Below Average < 2 - Exceptionally Low       Validity:   DESCRIPTOR       Dot Counting Test: --- --- Outside Normal Limits  RBANS Effort Index: --- --- Outside Normal Limits  WAIS-IV Reliable Digit Span: --- --- Within Normal Limits       Orientation:      Raw Score Percentile   NAB Orientation, Form 1 19/29 --- ---       Cognitive Screening:      Raw Score Percentile   SLUMS: 11/30 --- ---       RBANS, Form A: Standard Score/ Scaled Score Percentile   Total Score 49 <1 Exceptionally Low  Immediate Memory 73 4 Well Below Average    List Learning 2 <1 Exceptionally Low    Story Memory 8 25 Average  Visuospatial/Constructional 50 <1 Exceptionally Low    Figure Copy 1 <1 Exceptionally Low    Line Orientation 4/20 <2 Exceptionally Low  Language 57 <1 Exceptionally Low    Picture Naming 8/10 3-9 Well Below Average    Semantic Fluency 1 <1 Exceptionally Low  Attention 49 <1 Exceptionally Low    Digit Span 4 2 Well Below Average    Coding 1 <1 Exceptionally Low  Delayed Memory 44 <1 Exceptionally Low    List Recall 0/10 <2  Exceptionally Low    List Recognition 12/20 <2 Exceptionally Low    Story Recall 6 9 Below Average    Story Recognition 7/12 5-7 Well Below  Average    Figure Recall 2 <1 Exceptionally Low    Figure Recognition 2/8 2-3 Well Below Average        Intellectual Functioning:      Standard Score Percentile   Barona Formula Estimated Premorbid IQ: 102 9 Average        Standard Score Percentile   Test of Premorbid Functioning: 88 21 Below Average       Attention/Executive Function:     Trail Making Test (TMT): Raw Score (T Score) Percentile     Part A Discontinued --- Impaired    Part B Discontinued --- Impaired         Scaled Score Percentile   WAIS-IV Digit Span: 2 <1 Exceptionally Low    Forward 7 16 Below Average    Backward 5 5 Well Below Average    Sequencing 1 <1 Exceptionally Low       D-KEFS Color-Word Interference Test: Raw Score (Scaled Score) Percentile     Color Naming Discontinued --- Impaired    Word Reading Discontinued --- Impaired    Inhibition Not attempted --- ---    Inhibition/Switching Not attempted --- ---       D-KEFS Verbal Fluency Test: Raw Score (Scaled Score) Percentile     Letter Total Correct 5 (1) <1 Exceptionally Low    Category Total Correct 7 (1) <1 Exceptionally Low    Category Switching Total Correct 3 (1) <1 Exceptionally Low    Category Switching Accuracy 2 (1) <1 Exceptionally Low      Total Set Loss Errors 2 (10) 50 Average      Total Repetition Errors 2 (11) 63 Average       Language:      Raw Score Percentile   PPVT Screening Instrument: 8/16 --- Outside Normal Limits  Sentence Repetition: 12/22 3 Well Below Average       Verbal Fluency Test: Raw Score (T Score) Percentile     Phonemic Fluency (FAS) 5 (17) <1 Exceptionally Low    Animal Fluency 4 (7) <1 Exceptionally Low        NAB Language Module, Form 1: T Score Percentile     Oral Production 35 7 Well Below Average    Auditory Comprehension 19 <1 Exceptionally Low    Reading Comprehension 8/13 <1 Exceptionally Low    Naming 26/31 (28) 2 Well Below Average    Writing 19 <1 Exceptionally Low        Visuospatial/Visuoconstruction:      Raw Score Percentile   Clock Drawing: 2/10 --- Impaired        Scaled Score Percentile   WAIS-IV Block Design: 1 <1 Exceptionally Low  WAIS-IV Matrix Reasoning: 1 <1 Exceptionally Low       Sensory-Motor:     [place] Grooved Pegboard Test: Raw Score Percentile     Dominant Hand 160 secs.,  2 drops  <1 Exceptionally Low    Non-Dominant Hand 139 secs.,  1 drop  1 Exceptionally Low       Finger Tapping Test: Mean Percentile     Dominant Hand 46 16 Below Average    Non-Dominant Hand 46 42 Average       Mood and Personality:      Raw Score Percentile   PROMIS Depression Questionnaire: 30 --- Moderate  PROMIS Anxiety Questionnaire:  7 --- None to Slight       Additional Questionnaires:      Raw Score Percentile   PROMIS Sleep Disturbance Questionnaire: 19 --- None to Slight   Informed Consent and Coding/Compliance:   The current evaluation represents a clinical evaluation for the purposes previously outlined by the referral source and is in no way reflective of a forensic evaluation.   Mr. Kozloff was provided with a verbal description of the nature and purpose of the present neuropsychological evaluation. Also reviewed were the foreseeable risks and/or discomforts and benefits of the procedure, limits of confidentiality, and mandatory reporting requirements of this provider. The patient was given the opportunity to ask questions and receive answers about the evaluation. Oral consent to participate was provided by the patient.   This evaluation was conducted by Christia Reading, Ph.D., ABPP-CN, board certified clinical neuropsychologist. Mr. Bonwell completed a clinical interview with Dr. Melvyn Novas, billed as one unit [phone], and 130 minutes of cognitive testing and scoring, billed as one unit [phone] and three additional units [id number]. Psychometrist Cruzita Lederer, B.S., assisted Dr. Melvyn Novas with test administration and scoring procedures. As a separate and  discrete service, Dr. Melvyn Novas spent a total of 160 minutes in interpretation and report writing billed as one unit [phone] and two units 96133.

## 2021-06-14 ENCOUNTER — Ambulatory Visit: Payer: Medicare HMO | Attending: Physician Assistant | Admitting: Speech Pathology

## 2021-06-14 DIAGNOSIS — R4701 Aphasia: Secondary | ICD-10-CM | POA: Diagnosis present

## 2021-06-14 DIAGNOSIS — R41841 Cognitive communication deficit: Secondary | ICD-10-CM | POA: Diagnosis present

## 2021-06-14 NOTE — Therapy (Signed)
[facility] MAIN [facility] SERVICES [address] [place], [place], [address] Phone: [phone]   Fax:  [phone]  Speech Language Pathology Treatment  Patient Details  Name: Chad GowdaChristopher B Stone MRN: [id number] Date of Birth: 03/26/1970 Referring Provider (SLP): Marlowe KaysSara Wertman, PA-C   Encounter Date: 06/14/2021   End of Session - 06/14/21 1425     Visit Number 9    Number of Visits 25    Date for SLP Re-Evaluation 08/07/21    Authorization Type [employer] Medicare    Authorization - Visit Number 9    Progress Note Due on Visit 10    SLP Start Time 1300    SLP Stop Time  1400    SLP Time Calculation (min) 60 min    Activity Tolerance Patient tolerated treatment well             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   Febrile seizure    2814 months old   Frontotemporal dementia without behavioral disturbance 06/07/2021   PPA presentation, 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   Subjective Assessment - 06/14/21 1423     Subjective Pt brought photos from home    Patient is accompained by: --   Renato GailsPastor   Currently in Pain? No/denies                   ADULT SLP TREATMENT - 06/14/21 1430       General Information   Behavior/Cognition Alert;Cooperative;Pleasant mood    HPI Chad GowdaChristopher B Cunning is a 51 y.o. year old RH male with 3 year history of memory loss, [facility], referred for cognitive-linguistic evaluation by Marlowe KaysSara Wertman, PA-C. PMHx also includes hypertension, hyperlipidemia, glaucoma, depression, 1 cm L frontal  meningioma, migraine headaches, OSA on CPAP. MRI showing R temporal, R frontal, Left Frontal atrophy. MoCA on 05/01/21 with "deficiencies in most areas, especially in visuospatial executive 1 out of 5, naming, attention, delayed recall 3/5, orientation 0/5."      Treatment Provided   Treatment provided Cognitive-Linquistic      Cognitive-Linquistic Treatment   Treatment focused on Cognition;Patient/family/caregiver education    Skilled Treatment Graduate clinician supported Pt in adding June's appointments to calendar. Pt required occasional moderate cues for visuospatial deficits; pt perverated words/numbers ~30% of the time. Pt described pictures from home with cohesive sentences with min- mod. cues. Pt also generated responses to simple questions about pictures with mod. cues to create pages in memory book (family, camping, [employer]). Pt often hesitated and couldn't recall details surrounding the pictures.      Assessment / Recommendations / Plan   Plan Continue with current plan of care      Progression Toward Goals   Progression toward goals Progressing toward goals              SLP Education - 06/14/21 1425     Education Details visual supports for communication    Person(s) Educated Patient   friend   Methods Explanation    Comprehension Verbalized understanding  SLP Short Term Goals - 05/09/21 1215       SLP SHORT TERM GOAL #1   Title Pt will establish external aid for functional recall and bring to >75% of therapy sessions.    Time 6    Period Weeks    Status New    Target Date 06/20/21      SLP SHORT TERM GOAL #2   Title Patient will generate sentence responses to simple questions >90% accuracy with mod cues.    Time 6    Period Weeks    Status New    Target Date 06/20/21      SLP SHORT TERM GOAL #3   Title Pt will engage in simple conversation 3-5 minutes with supported conversation strategies or visual aid if necessary.    Time 6     Period Weeks    Status New    Target Date 06/20/21              SLP Long Term Goals - 05/09/21 1225       SLP LONG TERM GOAL #1   Title Pt will use external aid for functional recall of completed/upcoming activities 80% accuracy with rare min A.    Time 12    Period Weeks    Status New    Target Date 08/06/21      SLP LONG TERM GOAL #2   Title Patient will engage in simple-mod complex conversation for 5-8 minutes about a topic of interest, with supported conversation or use of visual aids if needed.    Time 12    Period Weeks    Status New    Target Date 08/06/21      SLP LONG TERM GOAL #3   Title Patient will demonstrate knowledge of appropriate activities to support language function outside of ST with assistance from family.    Time 12    Period Weeks    Status New    Target Date 08/06/21              Plan - 06/14/21 1431     Clinical Impression Statement Chad Stone is a pleasant 51 y.o. gentleman with moderate-severe cognitive communication and moderate language impairments. Recently completed neuropsychological evaluation with findings consistent with major neurocognitive disorder; Dr. Milbert Coulter felt pt demonstrates symptoms of non-fluent PPA. Continue to focus on family education on communication supports and maintaining safety. Pt benefits from verbal and visual communication supports to increase spontaneous speech. Creating a memory book will provide added visual support in conversation for Pt to recall and build upon details written in memory pages. Pt appears to have strong family support and is consistent in using calendar and daily to-do list. Continue skilled ST focused on compensation and strategy training to increase safety, prolong independence and improve social interactions and quality of life.    Speech Therapy Frequency 2x / week    Duration 12 weeks   estimate only 8 weeks necessary unless pt shows potential with alternative communication device    Treatment/Interventions Language facilitation;Environmental controls;SLP instruction and feedback;Compensatory techniques;Functional tasks;Compensatory strategies;Internal/external aids;Multimodal communcation approach;Patient/family education    Potential to Achieve Goals Good   -fair   Potential Considerations Ability to learn/carryover information;Previous level of function;Severity of impairments             Patient will benefit from skilled therapeutic intervention in order to improve the following deficits and impairments:   No diagnosis found.    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 [place], [place], [address] Phone: [phone]   Fax:  [phone]   Name: ARNET HOFFERBER MRN: [id number] Date of Birth: 1970/11/11

## 2021-06-19 ENCOUNTER — Ambulatory Visit: Payer: Medicare HMO | Admitting: Speech Pathology

## 2021-06-19 DIAGNOSIS — R41841 Cognitive communication deficit: Secondary | ICD-10-CM | POA: Diagnosis not present

## 2021-06-19 DIAGNOSIS — R4701 Aphasia: Secondary | ICD-10-CM

## 2021-06-20 NOTE — Therapy (Signed)
[place] MAIN [facility] SERVICES [address] [place], [place], [address] Phone: [phone]   Fax:  [phone]  Speech Language Pathology Treatment and Progress Note  Patient Details  Name: Chad Stone MRN: [id number] Date of Birth: 02-25-1970 Referring Provider (SLP): Sharene Butters, PA-C  Speech Therapy Progress Note  Dates of Reporting Period: 05/08/21 to 06/15/21  Objective: Patient has been seen for 10 speech therapy sessions this reporting period targeting cognitive communication deficits. Patient is making progress toward LTGs and met 2/3 STGs this reporting period. See skilled intervention, clinical impressions, and goals below for details.  Encounter Date: 06/19/2021   End of Session - 06/20/21 1316     Visit Number 10    Number of Visits 25    Date for SLP Re-Evaluation 08/07/21    Authorization Type [employer] Medicare    Authorization - Visit Number 10    Progress Note Due on Visit 10    SLP Start Time 1500    SLP Stop Time  1600    SLP Time Calculation (min) 60 min    Activity Tolerance Patient tolerated treatment well             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   Febrile seizure    43 months old   Frontotemporal dementia without behavioral disturbance 06/07/2021   PPA presentation, 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   Subjective Assessment - 06/20/21 0817     Subjective Has neuropsych results meeting next week    Currently in Pain? No/denies                   ADULT SLP TREATMENT - 06/20/21 0818       General Information    Behavior/Cognition Alert;Cooperative;Pleasant mood    HPI Chad Stone is a 51 y.o. 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 PMHx also includes hypertension, hyperlipidemia, glaucoma, depression, 1 cm L frontal meningioma, migraine headaches, OSA on CPAP. MRI showing R temporal, R frontal, Left Frontal atrophy. MoCA on 05/01/21 with "deficiencies in most areas, especially in visuospatial executive 1 out of 5, naming, attention, delayed recall 3/5, orientation 0/5."      Treatment Provided   Treatment provided Cognitive-Linquistic      Cognitive-Linquistic Treatment   Treatment focused on Cognition;Patient/family/caregiver education    Skilled Treatment Clinician, Pt, and Pt's mom agreed on reducing services to 1x a week. Clinician supported Pt in updating his calender with canceled appointments, with mod cues for error awareness. Pt. described pictures from home with cohesive sentences with mod to max cues. Pt often sought input from mom on details about the pictures. Clinician used pt input to create pages for memory book (friends, sister, jeep) as a visual aid to support communication. Patient required direct instruction to use visual aid to improve conversational narrative (mod cues), however once instructed, fluency improved.      Assessment / Recommendations / Plan   Plan Continue with current plan  of care      Progression Toward Goals   Progression toward goals Progressing toward goals              SLP Education - 06/20/21 1315     Education Details use written cues as a support for verbal expression    Person(s) Educated Patient;Parent(s)    Methods Explanation    Comprehension Verbalized understanding              SLP Short Term Goals - 06/20/21 1333       SLP SHORT TERM GOAL #1   Title Pt will establish external aid for  functional recall and bring to >75% of therapy sessions.    Time 6    Period Weeks    Status Achieved    Target Date 06/20/21      SLP SHORT TERM GOAL #2   Title Patient will generate sentence responses to simple questions >90% accuracy with mod cues.    Baseline 06/19/21: 75% with moderate cues    Time 6    Period Weeks    Status Partially Met    Target Date 06/20/21      SLP SHORT TERM GOAL #3   Title Pt will engage in simple conversation 3-5 minutes with supported conversation strategies or visual aid if necessary.    Time 6    Period Weeks    Status Achieved    Target Date 06/20/21              SLP Long Term Goals - 06/20/21 1335       SLP LONG TERM GOAL #1   Title Pt will use external aid for functional recall of completed/upcoming activities 80% accuracy with rare min A.    Time 12    Period Weeks    Status On-going      SLP LONG TERM GOAL #2   Title Patient will engage in simple-mod complex conversation for 5-8 minutes about a topic of interest, with supported conversation or use of visual aids if needed.    Time 12    Period Weeks    Status On-going      SLP LONG TERM GOAL #3   Title Patient will demonstrate knowledge of appropriate activities to support language function outside of ST with assistance from family.    Time 12    Period Weeks    Status On-going              Plan - 06/20/21 1316     Clinical Impression Statement Chad Stone is a pleasant 51 y.o. gentleman with moderate-severe cognitive communication and moderate language impairments. Recently completed neuropsychological evaluation with findings consistent with major neurocognitive disorder; Dr. Melvyn Novas felt pt demonstrates symptoms of non-fluent PPA. Continue to focus on family education on communication supports, maintaining safety, and aphasia. Patient benefitted from reducing competing visual stimulation to improve focus on task. Pt continues to benefit from verbal and visual support  in conversation from memory book. Plan to reformat memory pages to adapt to pt's visuospatial needs. Continue skilled ST with focus on functional communication and caregiver training to maximize communication abilities and support independence/quality of life.   Speech Therapy Frequency 1x / week    Duration 12 weeks  Frequency decreased to 1x per week with plan to continue over longer duration due to progressive language decline   Treatment/Interventions Language facilitation;Environmental controls;SLP instruction and feedback;Compensatory techniques;Functional tasks;Compensatory strategies;Internal/external aids;Multimodal communcation approach;Patient/family education    Potential to Achieve Goals Good   -  fair   Potential Considerations Ability to learn/carryover information;Previous level of function;Severity of impairments             Patient will benefit from skilled therapeutic intervention in order to improve the following deficits and impairments:   Cognitive communication deficit  Aphasia    Problem List Patient Active Problem List   Diagnosis Date Noted   Frontotemporal dementia without behavioral disturbance 06/07/2021   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

## 2021-06-21 ENCOUNTER — Encounter: Payer: Medicare HMO | Admitting: Speech Pathology

## 2021-06-26 ENCOUNTER — Ambulatory Visit: Payer: Medicare HMO | Admitting: Speech Pathology

## 2021-06-26 ENCOUNTER — Encounter: Payer: Medicare HMO | Admitting: Speech Pathology

## 2021-06-26 DIAGNOSIS — R4701 Aphasia: Secondary | ICD-10-CM

## 2021-06-26 DIAGNOSIS — R41841 Cognitive communication deficit: Secondary | ICD-10-CM

## 2021-06-27 NOTE — Therapy (Signed)
[place] MAIN [facility] SERVICES [address] [place], [place], [address] Phone: [phone]   Fax:  [phone]  Speech Language Pathology Treatment  Patient Details  Name: Chad Stone MRN: [id number] Date of Birth: 1970-07-16 Referring Provider (SLP): Sharene Butters, PA-C   Encounter Date: 06/26/2021   End of Session - 06/27/21 0959     Visit Number 11    Number of Visits 25    Date for SLP Re-Evaluation 08/07/21    Authorization Type [employer] Medicare    Authorization - Visit Number 1    Progress Note Due on Visit 10    SLP Start Time 1500    SLP Stop Time  1600    SLP Time Calculation (min) 60 min    Activity Tolerance Patient tolerated treatment well             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   Febrile seizure    44 months old   Frontotemporal dementia without behavioral disturbance 06/07/2021   PPA presentation, 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   Subjective Assessment - 06/27/21 1002     Subjective Arrives with mother                   ADULT SLP TREATMENT - 06/27/21 0959       General Information   Behavior/Cognition Alert;Cooperative;Pleasant mood    HPI Chad Stone is a 51 y.o. year old RH male with 3 year history of memory loss, initially followed by neurology at [facility] clinic for FTD, referred for cognitive-linguistic evaluation by Sharene Butters, PA-C. PMHx also includes hypertension, hyperlipidemia, glaucoma, depression, 1 cm L frontal meningioma, migraine headaches, OSA on CPAP. MRI showing R temporal, R frontal, Left  Frontal atrophy. MoCA on 05/01/21 with "deficiencies in most areas, especially in visuospatial executive 1 out of 5, naming, attention, delayed recall 3/5, orientation 0/5."      Cognitive-Linquistic Treatment   Treatment focused on Cognition;Patient/family/caregiver education    Skilled Treatment Clinician supported Pt in using calendar and schedule to recall and discuss what he did over the weekend. Clinician used visual cues (pictures from home) to aid Pt in discussing memories from the past to complete pages in memory book. Pt then used memory book pages with rare minimal cues to discuss/read about memories and family. Clinician introduced Lingraphica/ high tech AAC and it's potential use for Pt in daily tasks and during conversations with family. Pt explored device with max to mod cues. Plan for next time to continue trialing [place] for Pt.      Assessment / Recommendations / Plan   Plan Continue with current plan of care      Progression Toward Goals   Progression toward goals Progressing toward goals              SLP Education - 06/27/21 0959     Education Details other AAC options including Lingraphica    Person(s) Educated Patient;Parent(s)    Methods Explanation;Demonstration;Verbal cues    Comprehension Verbalized understanding;Verbal cues required;Need further instruction  SLP Short Term Goals - 06/20/21 1333       SLP SHORT TERM GOAL #1   Title Pt will establish external aid for functional recall and bring to >75% of therapy sessions.    Time 6    Period Weeks    Status Achieved    Target Date 06/20/21      SLP SHORT TERM GOAL #2   Title Patient will generate sentence responses to simple questions >90% accuracy with mod cues.    Baseline 06/19/21: 75% with moderate cues    Time 6    Period Weeks    Status Partially Met    Target Date 06/20/21      SLP SHORT TERM GOAL #3   Title Pt will engage in simple conversation 3-5 minutes with  supported conversation strategies or visual aid if necessary.    Time 6    Period Weeks    Status Achieved    Target Date 06/20/21              SLP Long Term Goals - 06/20/21 1335       SLP LONG TERM GOAL #1   Title Pt will use external aid for functional recall of completed/upcoming activities 80% accuracy with rare min A.    Time 12    Period Weeks    Status On-going      SLP LONG TERM GOAL #2   Title Patient will engage in simple-mod complex conversation for 5-8 minutes about a topic of interest, with supported conversation or use of visual aids if needed.    Time 12    Period Weeks    Status On-going      SLP LONG TERM GOAL #3   Title Patient will demonstrate knowledge of appropriate activities to support language function outside of ST with assistance from family.    Time 12    Period Weeks    Status On-going              Plan - 06/27/21 1000     Clinical Impression Statement Patient presents with moderate-severe cognitive communication and moderate language impairments; recent neuropsychological evaluation describes symptoms of non-fluent PPA. Care remains focused on family education on communication supports, maintaining safety, and aphasia. Pt benefits from visual cues and minimal stimulation/distractors to engage in reciprocal conversation with Clinician and family. Pt noted to be more comfortable and familiar with memory book as a visual aid to discuss likes/family/memories. Will consider potential for high-tech AAC, given pt's experience working in IT. Pt initially demonstrated hesitation to explore Lingraphica, Clinician faded max prompts once Pt showed more interest in the device. Continue skilled ST with focus on functional communication and caregiver training to maximize communication abilities and support independence/quality of life.    Speech Therapy Frequency 1x /week    Duration 12 weeks   Frequency decreased to 1x per week with plan to continue over  longer duration due to progressive language decline   Treatment/Interventions Language facilitation;Environmental controls;SLP instruction and feedback;Compensatory techniques;Functional tasks;Compensatory strategies;Internal/external aids;Multimodal communcation approach;Patient/family education    Potential to Achieve Goals Good   -fair   Potential Considerations Ability to learn/carryover information;Previous level of function;Severity of impairments             Patient will benefit from skilled therapeutic intervention in order to improve the following deficits and impairments:   Cognitive communication deficit  Aphasia    Problem List Patient Active Problem List   Diagnosis Date Noted   Frontotemporal dementia  without behavioral disturbance 06/07/2021   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 [place], [place], [address] Phone: [phone]   Fax:  [phone]   Name: Chad Stone MRN: [id number] Date of Birth: 20-Dec-1970

## 2021-06-28 ENCOUNTER — Encounter: Payer: Medicare HMO | Admitting: Speech Pathology

## 2021-06-29 ENCOUNTER — Ambulatory Visit (INDEPENDENT_AMBULATORY_CARE_PROVIDER_SITE_OTHER): Payer: Medicare HMO | Admitting: Psychology

## 2021-06-29 DIAGNOSIS — G3109 Other frontotemporal dementia: Secondary | ICD-10-CM

## 2021-06-29 DIAGNOSIS — G3101 Pick's disease: Secondary | ICD-10-CM | POA: Diagnosis not present

## 2021-06-29 DIAGNOSIS — F0280 Dementia in other diseases classified elsewhere without behavioral disturbance: Secondary | ICD-10-CM | POA: Diagnosis not present

## 2021-06-29 NOTE — Progress Notes (Signed)
   Neuropsychology Feedback Session Chad Stone. [facility] Department of Neurology  Reason for Referral:   Chad Stone is a 51 y.o. right-handed Caucasian male referred by  Marlowe Kays, PA-C , to characterize his current cognitive functioning and assist with diagnostic clarity and treatment planning in the context of abnormal neuroimaging concerning for frontotemporal lobar degeneration and ongoing expressive language deficits.   Feedback:   Chad Stone completed a comprehensive neuropsychological evaluation on 06/07/2021. Please refer to that encounter for the full report and recommendations. Briefly, results suggested diffuse cognitive impairment. He was able to learn and later recall story-based information reasonably well. Performance variability was also exhibited across basic attention. However, severe impairment was exhibited across all other tasks/domains. This includes processing speed, complex attention, executive functioning, receptive and expressive language, visuospatial abilities, and all performances across list and figure-based memory tasks. Fine motor speed was adequate bilaterally; however, severe impairment was also seen across tasks assessing more fine motor coordination and dexterity. The most likely etiology is the presence of frontotemporal lobar degeneration leading to a presentation of frontotemporal dementia (FTD). This is most strongly evidenced by neuroimaging, which has suggested advanced and progressive atrophy surrounding the frontal and temporal lobes. Given that Chad Stone and his mother denied any concerns surrounding impulsivity, behavioral disinhibition, or severe personality changes, his presentation would not align with the behavioral variant of FTD. 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 Chad Stone notably effortful speech,  impaired fluency, and agrammatism found within writing samples would best align with the non-fluent primary progressive aphasia subtype. However, comprehension difficulties were also quite significant, suggesting that he also may display features of the semantic dementia subtype. Recent neuroimaging seems to align better with former subtype relative to the latter. However, it certainly remains plausible that features of both are co-occurring.  Chad Stone was accompanied by his mother during the current feedback session. Content of the current session focused on the results of his neuropsychological evaluation. Chad Stone was given the opportunity to ask questions and his questions were answered. He was encouraged to reach out should additional questions arise. A copy of his report was provided at the conclusion of the visit.      30 minutes were spent conducting the current feedback session with Chad Stone, billed as one unit [phone].

## 2021-07-03 ENCOUNTER — Encounter: Payer: Medicare HMO | Admitting: Speech Pathology

## 2021-07-04 ENCOUNTER — Ambulatory Visit: Payer: Medicare HMO | Admitting: Speech Pathology

## 2021-07-04 DIAGNOSIS — R41841 Cognitive communication deficit: Secondary | ICD-10-CM | POA: Diagnosis not present

## 2021-07-04 DIAGNOSIS — R4701 Aphasia: Secondary | ICD-10-CM

## 2021-07-04 NOTE — Therapy (Signed)
OUTPATIENT SPEECH LANGUAGE PATHOLOGY TREATMENT NOTE   Patient Name: Chad Stone MRN: [id number] DOB:03/27/1970, 51 y.o., male Today's Date: 07/04/2021  PCP: Fulton Reek, MD REFERRING PROVIDER: Sharene Butters, PA-C   End of Session - 07/04/21 1810     Visit Number 12    Number of Visits 25    Date for SLP Re-Evaluation 08/07/21    Authorization Type [employer]na Medicare    Authorization - Visit Number 2    Progress Note Due on Visit 10    SLP Start Time 1505    SLP Stop Time  1605    SLP Time Calculation (min) 60 min    Activity Tolerance Patient tolerated treatment well             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   Febrile seizure    66 months old   Frontotemporal dementia without behavioral disturbance 06/07/2021   PPA presentation, 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 but also ongoing semantic impairment   Hyperlipidemia, mixed 06/05/2015   Nephrolithiasis    OSA (obstructive sleep apnea)    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 year old RH male with 3 year history of memory loss, initially followed by neurology at [facility] clinic for FTD, referred for cognitive-linguistic evaluation by Sharene Butters, PA-C. Pt had neuropsychological evaluation on 06/07/2021 findings of likely frontotemporal dementia (FTD), with  noted symptoms of language impairment (PPA), with both expressive and receptive language deficits. PMHx also includes hypertension, hyperlipidemia, glaucoma, depression, 1 cm L frontal meningioma, migraine headaches, OSA on CPAP.    THERAPY DIAG:  Cognitive communication deficit  Aphasia  Rationale for Evaluation and Treatment Rehabilitation  SUBJECTIVE: Arrives with sister Sharyn Lull  PAIN:  Are you having pain? No     OBJECTIVE:   TODAY'S TREATMENT: Patient's sister requesting information on how to support pt in his communication. SLP demonstrated use of visual aids and schedule and instructed on allowing more time, giving semantic and first-letter cues, and using written keywords which are noted to be helpful for pt. Sister provided cues appropriately to pt to fill in his calendar for next week with occasional demonstration. Patient used memory book to discuss family and personal interests, with poor attention to left; modified memory book to include only pages on the right. Demonstrated how high-tech AAC may be helpful to pt in making choices, stimulating conversation, and preserving recordings of pt's speech.   PATIENT EDUCATION: Education details: supported conversation for aphasia, use of visual aids including low and high tech AAC for language support Person educated: Patient and sister Education method: Explanation, Demonstration, and Verbal cues Education comprehension: verbalized understanding, verbal cues required, and needs further education   SLP Short Term Goals - 06/20/21 1333       SLP SHORT TERM GOAL #1   Title Pt will establish external aid for functional recall and bring to >  75% of therapy sessions.    Time 6    Period Weeks    Status Achieved    Target Date 06/20/21      SLP SHORT TERM GOAL #2   Title Patient will generate sentence responses to simple questions >90% accuracy with mod cues.    Baseline 06/19/21: 75% with moderate cues    Time 6    Period  Weeks    Status Partially Met    Target Date 06/20/21      SLP SHORT TERM GOAL #3   Title Pt will engage in simple conversation 3-5 minutes with supported conversation strategies or visual aid if necessary.    Time 6    Period Weeks    Status Achieved    Target Date 06/20/21              SLP Long Term Goals - 06/20/21 1335       SLP LONG TERM GOAL #1   Title Pt will use external aid for functional recall of completed/upcoming activities 80% accuracy with rare min A.    Time 12    Period Weeks    Status On-going      SLP LONG TERM GOAL #2   Title Patient will engage in simple-mod complex conversation for 5-8 minutes about a topic of interest, with supported conversation or use of visual aids if needed.    Time 12    Period Weeks    Status On-going      SLP LONG TERM GOAL #3   Title Patient will demonstrate knowledge of appropriate activities to support language function outside of ST with assistance from family.    Time 12    Period Weeks    Status On-going              Plan - 07/04/21 1810     Clinical Impression Statement Patient presents with moderate-severe cognitive communication and moderate language impairments; recent neuropsychological evaluation describes symptoms of non-fluent PPA as well as receptive deficits. Care remains focused on family education on communication supports, maintaining safety, and aphasia. Pt benefits from visual cues and minimal stimulation/distractors to engage in reciprocal conversation with Clinician and family. Will pursue device trial for high-tech AAC; pt and sister report interest in this to maximize communication as well as preserve recordings of pt's own speech. Continue skilled ST with focus on functional communication and caregiver training to maximize communication abilities and support independence/quality of life.    Speech Therapy Frequency 1x /week    Duration 12 weeks   Frequency decreased to 1x per week with plan to  continue over longer duration due to progressive language decline   Treatment/Interventions Language facilitation;Environmental controls;SLP instruction and feedback;Compensatory techniques;Functional tasks;Compensatory strategies;Internal/external aids;Multimodal communcation approach;Patient/family education    Potential to Achieve Goals Good   -fair   Potential Considerations Ability to learn/carryover information;Previous level of function;Severity of impairments             Deneise Lever, MS, CCC-SLP Speech-Language Pathologist ([place], [place] 07/04/2021, 6:11 PM

## 2021-07-05 ENCOUNTER — Encounter: Payer: Medicare HMO | Admitting: Speech Pathology

## 2021-07-10 ENCOUNTER — Ambulatory Visit: Payer: Medicare HMO | Admitting: Speech Pathology

## 2021-07-10 DIAGNOSIS — R41841 Cognitive communication deficit: Secondary | ICD-10-CM | POA: Diagnosis not present

## 2021-07-10 DIAGNOSIS — R4701 Aphasia: Secondary | ICD-10-CM

## 2021-07-11 ENCOUNTER — Encounter: Payer: Medicare HMO | Admitting: Speech Pathology

## 2021-07-11 NOTE — Therapy (Signed)
OUTPATIENT SPEECH LANGUAGE PATHOLOGY TREATMENT NOTE   Patient Name: Chad Stone MRN: [id number] DOB:1970/12/08, 51 y.o., male Today's Date: 07/11/2021  PCP: Fulton Reek, MD REFERRING PROVIDER: Sharene Butters, PA-C   End of Session - 07/11/21 1304     Visit Number 13    Number of Visits 25    Date for SLP Re-Evaluation 08/07/21    Authorization Type [employer]na Medicare    Authorization - Visit Number 3    Progress Note Due on Visit 10    SLP Start Time 1500    SLP Stop Time  1600    SLP Time Calculation (min) 60 min    Activity Tolerance Patient tolerated treatment well             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   Febrile seizure    19 months old   Frontotemporal dementia without behavioral disturbance 06/07/2021   PPA presentation, 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 but also ongoing semantic impairment   Hyperlipidemia, mixed 06/05/2015   Nephrolithiasis    OSA (obstructive sleep apnea)    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 year old RH male with 3 year history of memory loss, initially followed by neurology at [facility] clinic for FTD, referred for cognitive-linguistic evaluation by Sharene Butters, PA-C. Pt had neuropsychological evaluation on 06/07/2021 findings of likely frontotemporal dementia (FTD), with  noted symptoms of language impairment (PPA), with both expressive and receptive language deficits. PMHx also includes hypertension, hyperlipidemia, glaucoma, depression, 1 cm L frontal meningioma, migraine headaches, OSA on CPAP.    THERAPY DIAG:  Aphasia  Cognitive communication deficit  Rationale for Evaluation and Treatment Rehabilitation  SUBJECTIVE: Pt sighs  Pt accompanied by: family member (mother)   PAIN:  Are you having pain? No     OBJECTIVE:   TODAY'S TREATMENT: SLP provided moderate cues using pt's calendar as visual aid to facilitate recall and simple conversation re: events from his week. SLP customized clinic Lingraphica device with Pt's favorite restaurant/food orders and created scenario to demonstrate using AAC to communicate. With max cues for navigating device, pt used Lingraphica to communicate food order with min to mod cues, first using lingraphica as a visual aid to speak, then using device speech output in role play restaurant interaction (mod cues). Cues, education necessary for pt to use device to make selections vs rote selection of all icons. SLP created additional folders for dicussing pt interests [employer], speech, feelings). Pt was engaged with device and exploring folders for 10 mins with min to mod cues.   PATIENT EDUCATION: Education details: upcoming AAC device trial Person educated: Patient and sister Education method: Explanation, Demonstration, and Verbal cues Education comprehension: verbalized understanding, verbal cues required, and needs further education   SLP Short Term Goals - 06/20/21 1333       SLP SHORT TERM GOAL #1   Title  Pt will establish external aid for functional recall and bring to >75% of therapy sessions.    Time 6    Period Weeks    Status Achieved    Target Date 06/20/21      SLP SHORT TERM GOAL #2   Title Patient will generate sentence responses to simple questions >90% accuracy with mod cues.    Baseline 06/19/21:  75% with moderate cues    Time 6    Period Weeks    Status Partially Met    Target Date 06/20/21      SLP SHORT TERM GOAL #3   Title Pt will engage in simple conversation 3-5 minutes with supported conversation strategies or visual aid if necessary.    Time 6    Period Weeks    Status Achieved    Target Date 06/20/21              SLP Long Term Goals - 06/20/21 1335       SLP LONG TERM GOAL #1   Title Pt will use external aid for functional recall of completed/upcoming activities 80% accuracy with rare min A.    Time 12    Period Weeks    Status On-going      SLP LONG TERM GOAL #2   Title Patient will engage in simple-mod complex conversation for 5-8 minutes about a topic of interest, with supported conversation or use of visual aids if needed.    Time 12    Period Weeks    Status On-going      SLP LONG TERM GOAL #3   Title Patient will demonstrate knowledge of appropriate activities to support language function outside of ST with assistance from family.    Time 12    Period Weeks    Status On-going              Plan - 07/04/21 1810     Clinical Impression Statement Patient presents with moderate-severe cognitive communication and moderate language impairments; recent neuropsychological evaluation describes symptoms of non-fluent PPA as well as receptive deficits. Pt benefits from visual cues and AAC to aid simple responses and promote conversational interaction. Pt has been successful in scanning and selecting choices on Lingraphica with min to mod cues, however needs max support for navigating and scrolling through pages. Continue skilled ST with focus on functional communication and caregiver training to maximize communication abilities and support independence/quality of life.    Speech Therapy Frequency 1x /week    Duration 12 weeks   Frequency decreased to 1x per week with plan to continue over longer duration due to progressive language decline    Treatment/Interventions Language facilitation;Environmental controls;SLP instruction and feedback;Compensatory techniques;Functional tasks;Compensatory strategies;Internal/external aids;Multimodal communcation approach;Patient/family education    Potential to Achieve Goals Good   -fair   Potential Considerations Ability to learn/carryover information;Previous level of function;Severity of impairments             Deneise Lever, MS, [employer] Pathologist ([place], [place] 07/11/2021, 1:05 PM

## 2021-07-16 ENCOUNTER — Encounter: Payer: Medicare HMO | Admitting: Speech Pathology

## 2021-07-18 ENCOUNTER — Ambulatory Visit: Payer: Medicare HMO | Attending: Physician Assistant | Admitting: Speech Pathology

## 2021-07-18 DIAGNOSIS — R4701 Aphasia: Secondary | ICD-10-CM | POA: Insufficient documentation

## 2021-07-18 DIAGNOSIS — R41841 Cognitive communication deficit: Secondary | ICD-10-CM | POA: Insufficient documentation

## 2021-07-19 NOTE — Therapy (Addendum)
OUTPATIENT SPEECH LANGUAGE PATHOLOGY TREATMENT NOTE   Patient Name: Chad Stone MRN: [id number] DOB:February 13, 1970, 51 y.o., male 21 Date: 07/19/2021  PCP: Fulton Reek, MD REFERRING PROVIDER: Sharene Butters, PA-C   End of Session - 07/18/21 0940     Visit Number 14    Number of Visits 25    Date for SLP Re-Evaluation 08/07/21    Authorization Type [employer]    Authorization - Visit Number 3    Progress Note Due on Visit 10    SLP Start Time 1500    SLP Stop Time  1600    SLP Time Calculation (min) 60 min    Activity Tolerance Patient tolerated treatment well             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   Febrile seizure    14 months old   Frontotemporal dementia without behavioral disturbance 06/07/2021   PPA presentation, 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 but also ongoing semantic impairment   Hyperlipidemia, mixed 06/05/2015   Nephrolithiasis    OSA (obstructive sleep apnea)    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 year old RH male with 3 year history of memory loss, initially followed by neurology at [facility] clinic for FTD, referred for cognitive-linguistic evaluation by Sharene Butters, PA-C. Pt had neuropsychological evaluation on 06/07/2021 findings of likely frontotemporal dementia (FTD), with  noted symptoms of language impairment (PPA), with both expressive and receptive language deficits. PMHx also includes hypertension, hyperlipidemia, glaucoma, depression, 1 cm L frontal meningioma, migraine headaches, OSA on CPAP.    THERAPY DIAG:  Aphasia  Cognitive communication deficit  Rationale for Evaluation and Treatment Rehabilitation  SUBJECTIVE: Pt is eager to select and explore Lingraphica device  Pt accompanied by: family member (sister)   PAIN:  Are you having pain? No     OBJECTIVE:   TODAY'S TREATMENT: Clinician used customized Lingraphica AAC device to educate sister on programing device. Clinician aided Pt and family in adding favorite restaurants, common food orders, and favorite foods with mod cues. Clinician used general food and restaurant icons to aid Pt in scanning and selecting his favorites to add to his folders with mod cues with an increase in response from Pt. Clinician recorded Pt's voice prompting him to say his common food orders with mod cues. Clinician prompted Pt to combine "I want" icon with a selection of his choice on AAC device 3x with mod cues. Plan for next time to program Pt's device and simulate conversations with device.   PATIENT EDUCATION: Education details: programming AAC device  Person educated: Patient and sister Education method: Explanation, Demonstration, and Verbal cues Education comprehension: verbalized understanding, verbal cues required, and needs further education   SLP Short Term Goals - 06/20/21 1333       SLP SHORT TERM GOAL #1   Title Pt will establish external aid for  functional recall and bring to >75% of therapy sessions.    Time 6    Period Weeks    Status Achieved    Target Date 06/20/21      SLP SHORT TERM GOAL #2   Title Patient will generate sentence responses to simple questions >90% accuracy with mod cues.    Baseline 06/19/21: 75% with moderate cues    Time 6    Period Weeks    Status Partially Met     Target Date 06/20/21      SLP SHORT TERM GOAL #3   Title Pt will engage in simple conversation 3-5 minutes with supported conversation strategies or visual aid if necessary.    Time 6    Period Weeks    Status Achieved    Target Date 06/20/21              SLP Long Term Goals - 06/20/21 1335       SLP LONG TERM GOAL #1   Title Pt will use external aid for functional recall of completed/upcoming activities 80% accuracy with rare min A.    Time 12    Period Weeks    Status On-going      SLP LONG TERM GOAL #2   Title Patient will engage in simple-mod complex conversation for 5-8 minutes about a topic of interest, with supported conversation or use of visual aids if needed.    Time 12    Period Weeks    Status On-going      SLP LONG TERM GOAL #3   Title Patient will demonstrate knowledge of appropriate activities to support language function outside of ST with assistance from family.    Time 12    Period Weeks    Status On-going              Plan - 07/04/21 1810     Clinical Impression Statement Patient presents with moderate-severe cognitive communication and moderate language impairments; recent neuropsychological evaluation describes symptoms of non-fluent PPA as well as receptive deficits. Pt benefits from visual cues and AAC to aid simple responses and promote conversational interaction. Pt has been successful in scanning and selecting choices on Lingraphica with min to mod cues, however needs max support for navigating and scrolling through pages. Pt continues to appear enthusiastic to explore device, make selections, and record his voice. Continue skilled ST with focus on functional communication and caregiver training to maximize communication abilities and support independence/quality of life.    Speech Therapy Frequency 1x /week    Duration 12 weeks   Frequency decreased to 1x per week with plan to continue over longer duration due to progressive language decline    Treatment/Interventions Language facilitation;Environmental controls;SLP instruction and feedback;Compensatory techniques;Functional tasks;Compensatory strategies;Internal/external aids;Multimodal communcation approach;Patient/family education    Potential to Achieve Goals Good   -fair   Potential Considerations Ability to learn/carryover information;Previous level of function;Severity of impairments              Glennie Hawk, Student-SLP 07/19/2021, 9:43 AM

## 2021-07-20 ENCOUNTER — Ambulatory Visit (INDEPENDENT_AMBULATORY_CARE_PROVIDER_SITE_OTHER): Payer: Medicare HMO | Admitting: Physician Assistant

## 2021-07-20 ENCOUNTER — Encounter: Payer: Self-pay | Admitting: Physician Assistant

## 2021-07-20 VITALS — BP 109/73 | HR 75 | Resp 18 | Ht 72.0 in | Wt 213.0 lb

## 2021-07-20 DIAGNOSIS — G3109 Other frontotemporal dementia: Secondary | ICD-10-CM | POA: Diagnosis not present

## 2021-07-20 DIAGNOSIS — F0280 Dementia in other diseases classified elsewhere without behavioral disturbance: Secondary | ICD-10-CM

## 2021-07-20 DIAGNOSIS — G3101 Pick's disease: Secondary | ICD-10-CM | POA: Diagnosis not present

## 2021-07-20 NOTE — Progress Notes (Unsigned)
Assessment/Plan:   Frontotemporal Dementia , PPA presentation without behavioral disturbance  Chad Stone is a very pleasant 51 y.o. RH male seen today in follow up for memory loss. Patient is currently on   Recommendations:    Continue Memantine10 mg bid  Side effects were discussed Lumbar Puncture to rule out additional etiologieshas been ordered, not performed to date  Continue Speech Therapy  FDG PET for FTD  Follow up in   months.   Case discussed with Dr. Karel Stone who agrees with the plan     Subjective:    This patient is accompanied in the office by his who supplements the history.  Previous records as well as any outside records available were reviewed prior to todays visit.Patient was last seen at our office on  at which time his    Any changes in memory since last visit? Does better talking, uses a tablet that helps him. To try for 2 weeks  Fills a form every day, more comgortable with stuff I do every day  Patient lives alone  repeats oneself? Denies  Disoriented when walking into a room?  Patient denies   Leaving objects in unusual places?  Patient denies   Ambulates  with difficulty?   Patient denies   Recent falls?  Patient denies   Any head injuries?  Patient denies   History of seizures?   Patient denies   Wandering behavior?  Patient denies   Patient drives?   Patient no longer drives  Any mood changes such irritability agitation?  Patient denies   Any history of depression?:  Patient denies   Hallucinations?  Patient denies   Paranoia?  Patient denies   Patient reports that he sleeps well without vivid dreams, REM behavior or sleepwalking   History of sleep apnea?  Patient denies   Any hygiene concerns?  Patient denies   Independent of bathing and dressing?  Endorsed  Does the patient needs help with medications?  Denies Who is in charge of the finances?   is in charge    Any changes in appetite?  Patient denies   Patient have trouble  swallowing? Patient denies   Does the patient cook?  Patient denies   Any kitchen accidents such as leaving the stove on? Patient denies   Any headaches?  Patient denies   Double vision? Patient denies   Any focal numbness or tingling?  Patient denies   Chronic back pain Patient denies   Unilateral weakness?  Patient denies   Any tremors?  Patient denies   Any history of anosmia?  Patient denies   Any incontinence of urine?  Patient denies   Any bowel dysfunction?   Patient denies       Neurocognitive testing 06/07/21 Dr. Milbert Stone  Major Neurocognitive Disorder ("dementia") at the present time.  The most likely etiology is the presence of frontotemporal lobar degeneration leading to a presentation of frontotemporal dementia (FTD). This is most strongly evidenced by neuroimaging, which has suggested advanced and progressive atrophy surrounding the frontal and temporal lobes. FTD is best described as an umbrella term which encompasses several different presentations. These are commonly grouped into the behavioral variant and several language variants. Given that Chad Stone and his mother denied any concerns surrounding impulsivity, behavioral disinhibition, or severe personality changes, his presentation would not align with the behavioral variant of FTD. However, he does exhibit many symptoms of a language variant of this illness, commonly referred to as a primary progressive aphasia (PPA)  presentation. 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 However, comprehension difficulties were also quite significant, suggesting that he also may display features of the semantic dementia subtype. Recent neuroimaging seems to align better with former subtype relative to the latter. However, it certainly remains plausible that features of both are co-occurring. Chad Stone dementia  presentation would appear to be fairly advanced given the extent of diffuse impairment. While it can be common to see language and executive functioning deficits in any FTD presentation, this will eventually spread to all areas of the brain as the disease progresses.    PREVIOUS MEDICATIONS:   CURRENT MEDICATIONS:  Outpatient Encounter Medications as of 07/20/2021  Medication Sig   albuterol (VENTOLIN HFA) 108 (90 Base) MCG/ACT inhaler Inhale 1-2 puffs into the lungs every 6 (six) hours as needed (cough).   amLODipine (NORVASC) 5 MG tablet Take 5 mg by mouth daily.   amoxicillin-clavulanate (AUGMENTIN) 875-125 MG tablet Take 1 tablet by mouth 2 (two) times daily.   amphetamine-dextroamphetamine (ADDERALL) 10 MG tablet Take 10 mg by mouth daily with breakfast.    bisoprolol (ZEBETA) 5 MG tablet Take 1 tablet by mouth daily.   busPIRone (BUSPAR) 10 MG tablet Take 1 tablet by mouth 2 (two) times daily.   diclofenac Sodium (VOLTAREN) 1 % GEL Apply topically.   donepezil (ARICEPT) 10 MG tablet Take 1 tablet by mouth at bedtime.   escitalopram (LEXAPRO) 10 MG tablet Take 10 mg by mouth daily.   etodolac (LODINE) 500 MG tablet Take 500 mg by mouth 2 (two) times daily.   latanoprost (XALATAN) 0.005 % ophthalmic solution Place 1 drop into both eyes at bedtime.    Naproxen Sodium (ALEVE PO) Take 500 mg elemental calcium/kg/hr by mouth daily as needed.   olmesartan (BENICAR) 40 MG tablet Take by mouth.   olmesartan-hydrochlorothiazide (BENICAR HCT) 20-12.5 MG tablet Take 1 tablet by mouth daily.   predniSONE (STERAPRED UNI-PAK 48 TAB) 10 MG (48) TBPK tablet See admin instructions. see package   rosuvastatin (CRESTOR) 10 MG tablet Take 1 tablet (10 mg total) by mouth daily.   SUMAtriptan (IMITREX) 100 MG tablet Take by mouth.   venlafaxine XR (EFFEXOR-XR) 75 MG 24 hr capsule Take 75 mg by mouth daily with breakfast.    No facility-administered encounter medications on file as of 07/20/2021.        No  data to display            05/02/2021    6:00 AM  Montreal Cognitive Assessment   Visuospatial/ Executive (0/5) 1  Naming (0/3) 2  Attention: Read list of digits (0/2) 0  Attention: Read list of letters (0/1) 1  Attention: Serial 7 subtraction starting at 100 (0/3) 0  Language: Repeat phrase (0/2) 2  Language : Fluency (0/1) 0  Abstraction (0/2) 1  Delayed Recall (0/5) 3  Orientation (0/6) 0  Total 10  Adjusted Score (based on education) 10    Objective:     PHYSICAL EXAMINATION:    VITALS:  There were no vitals filed for this visit.  GEN:  The patient appears stated age and is in NAD. HEENT:  Normocephalic, atraumatic.   Neurological examination:  General: NAD, well-groomed, appears stated age. Orientation: The patient is alert. Oriented to person, place and date Cranial nerves: There is good facial symmetry.The speech is fluent and clear. No aphasia or dysarthria. Fund of knowledge is appropriate. Recent  and remote memory are impaired. Attention and concentration are reduced.  Able to name objects and repeat phrases.  Hearing is intact to conversational tone.    Sensation: Sensation is intact to light touch throughout Motor: Strength is at least antigravity x4. Tremors: none  DTR's 2/4 in UE/LE     Movement examination: Tone: There is normal tone in the UE/LE Abnormal movements:  no tremor.  No myoclonus.  No asterixis.   Coordination:  There is no decremation with RAM's. Normal finger to nose  Gait and Station: The patient has no difficulty arising out of a deep-seated chair without the use of the hands. The patient's stride length is good.  Gait is cautious and narrow.    Thank you for allowing [place] the opportunity to participate in the care of this nice patient. Please do not hesitate to contact us for any questions or concerns.   Total time spent on today's visit was *** minutes dedicated to this patient today, preparing to see patient, examining the patient,  ordering tests and/or medications and counseling the patient, documenting clinical information in the EHR or other health record, independently interpreting results and communicating results to the patient/family, discussing treatment and goals, answering patient's questions and coordinating care.  Cc:  [name], MD  Marlowe Kays 07/20/2021 9:50 AM

## 2021-07-20 NOTE — Patient Instructions (Addendum)
It was a pleasure to see you today at our office.   Recommendations:  Follow up in  6 months Continue donepezil 10 mg daily ( 2x5 mg pills daily )  Lumbar Puncture  PET scan of the brain   Whom to call:  Memory  decline, memory medications: Call our office [phone]   For psychiatric meds, mood meds: Please have your primary care physician manage these medications.   Counseling regarding caregiver distress, including caregiver depression, anxiety and issues regarding community resources, adult day care programs, adult living facilities, or memory care questions:   Feel free to contact Misty Lisabeth Register, Social Worker at [phone]   For assessment of decision of mental capacity and competency:  Call Dr. Erick Blinks, geriatric psychiatrist at [phone]  For guidance in geriatric dementia issues please call [facility] [phone]    If you have any severe symptoms of a stroke, or other severe issues such as confusion,severe chills or fever, etc call 911 or go to the ER as you may need to be evaluated further     Feel free to go to the following database for funded clinical studies conducted around the world: RankChecks.se   https://www.triadclinicaltrials.com/     RECOMMENDATIONS FOR ALL PATIENTS WITH MEMORY PROBLEMS: 1. Continue to exercise (Recommend 30 minutes of walking everyday, or 3 hours every week) 2. Increase social interactions - continue going to [place] and enjoy social gatherings with friends and family 3. Eat healthy, avoid fried foods and eat more fruits and vegetables 4. Maintain adequate blood pressure, blood sugar, and blood cholesterol level. Reducing the risk of stroke and cardiovascular disease also helps promoting better memory. 5. Avoid stressful situations. Live a simple life and avoid aggravations. Organize your time and prepare for the next day in anticipation. 6. Sleep well, avoid any interruptions of sleep and  avoid any distractions in the bedroom that may interfere with adequate sleep quality 7. Avoid sugar, avoid sweets as there is a strong link between excessive sugar intake, diabetes, and cognitive impairment We discussed the Mediterranean diet, which has been shown to help patients reduce the risk of progressive memory disorders and reduces cardiovascular risk. This includes eating fish, eat fruits and green leafy vegetables, nuts like almonds and hazelnuts, walnuts, and also use olive oil. Avoid fast foods and fried foods as much as possible. Avoid sweets and sugar as sugar use has been linked to worsening of memory function.  There is always a concern of gradual progression of memory problems. If this is the case, then we may need to adjust level of care according to patient needs. Support, both to the patient and caregiver, should then be put into place.    FALL PRECAUTIONS: Be cautious when walking. Scan the area for obstacles that may increase the risk of trips and falls. When getting up in the mornings, sit up at the edge of the bed for a few minutes before getting out of bed. Consider elevating the bed at the head end to avoid drop of blood pressure when getting up. Walk always in a well-lit room (use night lights in the walls). Avoid area rugs or power cords from appliances in the middle of the walkways. Use a walker or a cane if necessary and consider physical therapy for balance exercise. Get your eyesight checked regularly.  FINANCIAL OVERSIGHT: Supervision, especially oversight when making financial decisions or transactions is also recommended.  HOME SAFETY: Consider the safety of the kitchen when operating appliances like stoves,  microwave oven, and blender. Consider having supervision and share cooking responsibilities until no longer able to participate in those. Accidents with firearms and other hazards in the house should be identified and addressed as well.   ABILITY TO BE LEFT ALONE:  If patient is unable to contact 911 operator, consider using Life[employer], or when the need is there, arrange for someone to stay with patients. Smoking is a fire hazard, consider supervision or cessation. Risk of wandering should be assessed by caregiver and if detected at any point, supervision and safe proof recommendations should be instituted.  MEDICATION SUPERVISION: Inability to self-administer medication needs to be constantly addressed. Implement a mechanism to ensure safe administration of the medications.   DRIVING: Regarding driving, in patients with progressive memory problems, driving will be impaired. We advise to have someone else do the driving if trouble finding directions or if minor accidents are reported. Independent driving assessment is available to determine safety of driving.   If you are interested in the driving assessment, you can contact the following:  [employer] in [place] [phone]  [facility] [phone]  [facility] [phone]  [facility] [phone] or [phone]

## 2021-07-24 ENCOUNTER — Encounter: Payer: Medicare HMO | Admitting: Speech Pathology

## 2021-07-24 ENCOUNTER — Ambulatory Visit: Payer: Medicare HMO | Admitting: Speech Pathology

## 2021-07-24 DIAGNOSIS — R4701 Aphasia: Secondary | ICD-10-CM

## 2021-07-24 DIAGNOSIS — R41841 Cognitive communication deficit: Secondary | ICD-10-CM

## 2021-07-25 ENCOUNTER — Ambulatory Visit
Admission: RE | Admit: 2021-07-25 | Discharge: 2021-07-25 | Disposition: A | Discharge status: Home / Self Care | Payer: Medicare HMO | Source: Ambulatory Visit | Attending: Physician Assistant | Admitting: Physician Assistant

## 2021-07-25 ENCOUNTER — Other Ambulatory Visit (HOSPITAL_COMMUNITY)
Admission: RE | Admit: 2021-07-25 | Discharge: 2021-07-25 | Disposition: A | Discharge status: Home / Self Care | Payer: Medicare HMO | Source: Ambulatory Visit | Attending: Physician Assistant | Admitting: Physician Assistant

## 2021-07-25 VITALS — BP 110/78 | HR 60

## 2021-07-25 DIAGNOSIS — G309 Alzheimer's disease, unspecified: Secondary | ICD-10-CM | POA: Diagnosis present

## 2021-07-25 DIAGNOSIS — G319 Degenerative disease of nervous system, unspecified: Secondary | ICD-10-CM

## 2021-07-25 DIAGNOSIS — F0280 Dementia in other diseases classified elsewhere without behavioral disturbance: Secondary | ICD-10-CM | POA: Diagnosis present

## 2021-07-25 DIAGNOSIS — G3109 Other frontotemporal dementia: Secondary | ICD-10-CM | POA: Insufficient documentation

## 2021-07-25 NOTE — Discharge Instructions (Signed)

## 2021-07-25 NOTE — Therapy (Signed)
OUTPATIENT SPEECH LANGUAGE PATHOLOGY TREATMENT NOTE   Patient Name: Chad Stone MRN: [id number] DOB:Dec 25, 1970, 51 y.o., male Today's Date: 07/25/2021  PCP: Fulton Reek, MD REFERRING PROVIDER: Sharene Butters, PA-C   End of Session - 07/25/21 0846     Visit Number 15    Number of Visits 25    Date for SLP Re-Evaluation 08/07/21    Authorization Type [employer]    Authorization - Visit Number 5    Progress Note Due on Visit 10    SLP Start Time 1600    SLP Stop Time  1710    SLP Time Calculation (min) 70 min    Activity Tolerance Patient tolerated treatment well               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   Febrile seizure    38 months old   Frontotemporal dementia without behavioral disturbance 06/07/2021   PPA presentation, 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 but also ongoing semantic impairment   Hyperlipidemia, mixed 06/05/2015   Nephrolithiasis    OSA (obstructive sleep apnea)    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 year old RH male with 3 year history of memory loss, initially followed by neurology at [facility] clinic for FTD, referred for cognitive-linguistic evaluation by Sharene Butters, PA-C. Pt had neuropsychological evaluation on 06/07/2021 findings of likely frontotemporal dementia (FTD),  with noted symptoms of language impairment (PPA), with both expressive and receptive language deficits. PMHx also includes hypertension, hyperlipidemia, glaucoma, depression, 1 cm L frontal meningioma, migraine headaches, OSA on CPAP.    THERAPY DIAG:  Aphasia  Cognitive communication deficit  Rationale for Evaluation and Treatment Rehabilitation  SUBJECTIVE: Pt used AAC device to select preferred foods.  Pt accompanied by: family member (sister Sharyn Lull)   PAIN:  Are you having pain? No     OBJECTIVE:   TODAY'S TREATMENT: SLP facilitated consultation to initiate [employer] communication device trial with Pt, and Pt's sister and [employer] representative. Clinician aided Pt and Pt's sister in programming trial AAC device (adding icons/folders, organizing icons etc.) with mod cues. Clinician prompted Pt to identify correct folder/location for with 83% acc with min to mod cues. Clinician role-played grocery shopping scenario with Pt, asking Pt to select needed food items. Clinician modeled using "need more" icon combined with food/grocery icon. Pt returned demonstration, and combined icons 4x with mod cues.   PATIENT EDUCATION: Education details: programming AAC device  Person educated: Patient and sister Education method: Explanation, Demonstration, and Verbal cues Education comprehension: verbalized understanding, verbal cues required, and needs further education   SLP Short Term Goals - 06/20/21 1333       SLP SHORT TERM GOAL #1   Title Pt will establish external aid for functional recall and bring to >75% of therapy sessions.    Time 6    Period Weeks  Status Achieved    Target Date 06/20/21      SLP SHORT TERM GOAL #2   Title Patient will generate sentence responses to simple questions >90% accuracy with mod cues.    Baseline 06/19/21: 75% with moderate cues    Time 6    Period Weeks    Status Partially Met    Target Date 06/20/21      SLP SHORT TERM GOAL #3    Title Pt will engage in simple conversation 3-5 minutes with supported conversation strategies or visual aid if necessary.    Time 6    Period Weeks    Status Achieved    Target Date 06/20/21              SLP Long Term Goals - 06/20/21 1335       SLP LONG TERM GOAL #1   Title Pt will use external aid for functional recall of completed/upcoming activities 80% accuracy with rare min A.    Time 12    Period Weeks    Status On-going      SLP LONG TERM GOAL #2   Title Patient will engage in simple-mod complex conversation for 5-8 minutes about a topic of interest, with supported conversation or use of visual aids if needed.    Time 12    Period Weeks    Status On-going      SLP LONG TERM GOAL #3   Title Patient will demonstrate knowledge of appropriate activities to support language function outside of ST with assistance from family.    Time 12    Period Weeks    Status On-going              Plan - 07/04/21 1810     Clinical Impression Statement Patient presents with moderate-severe cognitive communication and moderate language impairments; recent neuropsychological evaluation describes symptoms of non-fluent PPA as well as receptive deficits. Pt has been successful in scanning and selecting choices on [employer] with min to mod cues. Continues to require support for navigating device and scrolling with improved awareness of location of folders. Pt's increased physical signs of nervousness/anxiety (breathing hard, nervous body movement) noted to indicate need for restroom, after Pt left to use the restroom with min communication initiation. Pt noted to have difficulty navigating back to therapy room after using restroom. Plan to add emergency folder and bathroom icon to AAC device to target communication need and aid recall. Continue skilled ST with focus on functional communication and caregiver training to maximize communication abilities and support independence/quality of  life.    Speech Therapy Frequency 1x /week    Duration 12 weeks   Frequency decreased to 1x per week with plan to continue over longer duration due to progressive language decline   Treatment/Interventions Language facilitation;Environmental controls;SLP instruction and feedback;Compensatory techniques;Functional tasks;Compensatory strategies;Internal/external aids;Multimodal communcation approach;Patient/family education    Potential to Achieve Goals Good   -fair   Potential Considerations Ability to learn/carryover information;Previous level of function;Severity of impairments              Glennie Hawk Student SLP   I agree with the following treatment note after reviewing documentation.  This session was performed under the supervision of a licensed clinician.   Deneise Lever, MS, Actor

## 2021-07-25 NOTE — Progress Notes (Signed)
2 vials of blood drawn from pts RAC by [place] RN to be sent off with LP lab work. 1 successful attempt, pt tolerated well. Gauze and tape applied after.

## 2021-07-26 ENCOUNTER — Encounter: Payer: Medicare HMO | Admitting: Speech Pathology

## 2021-07-26 LAB — CYTOLOGY - NON PAP

## 2021-07-29 LAB — MAYO MISC ORDER,CSF

## 2021-07-31 ENCOUNTER — Encounter: Payer: Medicare HMO | Admitting: Speech Pathology

## 2021-07-31 ENCOUNTER — Ambulatory Visit: Payer: Medicare HMO | Admitting: Speech Pathology

## 2021-07-31 DIAGNOSIS — R41841 Cognitive communication deficit: Secondary | ICD-10-CM

## 2021-07-31 DIAGNOSIS — R4701 Aphasia: Secondary | ICD-10-CM

## 2021-08-01 NOTE — Therapy (Signed)
OUTPATIENT SPEECH LANGUAGE PATHOLOGY TREATMENT NOTE   Patient Name: Chad Stone MRN: [id number] DOB:October 16, 1970, 51 y.o., male Today's Date: 08/01/2021  PCP: Fulton Reek, MD REFERRING PROVIDER: Sharene Butters, PA-C   End of Session - 07/31/21 0856     Visit Number 16    Number of Visits 25    Date for SLP Re-Evaluation 08/07/21    Authorization Type [employer] Medicare    Authorization - Visit Number 6    Progress Note Due on Visit 10    SLP Start Time 1500    SLP Stop Time  1600    SLP Time Calculation (min) 60 min    Activity Tolerance Patient tolerated treatment well               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   Febrile seizure    28 months old   Frontotemporal dementia without behavioral disturbance 06/07/2021   PPA presentation, 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 but also ongoing semantic impairment   Hyperlipidemia, mixed 06/05/2015   Nephrolithiasis    OSA (obstructive sleep apnea)    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 year old RH male with 3 year history of memory loss, initially followed by neurology at [facility] clinic for FTD, referred for cognitive-linguistic evaluation by Sharene Butters, PA-C. Pt had neuropsychological evaluation on 06/07/2021 findings of likely frontotemporal dementia (FTD),  with noted symptoms of language impairment (PPA), with both expressive and receptive language deficits. PMHx also includes hypertension, hyperlipidemia, glaucoma, depression, 1 cm L frontal meningioma, migraine headaches, OSA on CPAP.    THERAPY DIAG:  Aphasia  Cognitive communication deficit  Rationale for Evaluation and Treatment Rehabilitation  SUBJECTIVE: Pt arrived with new icons on AAC device  Pt accompanied by: family member (sister Sharyn Lull)   PAIN:  Are you having pain? No     OBJECTIVE:   TODAY'S TREATMENT: Clinician aided Pt in reviewing icons created by sister (memories) with mod cues. Answered sister's questions regarding customization and educated on purpose of device as a communication modality, as well as focus on adding icons pt may actually need to use to communicate. Modified device output by adding conversational phrases and sentences for context and to aid in more natural communication. Pt verbalized phrases to record with min cues/models. Clinician introduced bathroom icon and emergency folder and modified icons for Pt's needs. Clinician role-played emergency scenario with Pt, asking Pt to select icons appropriate to specific emergency situations with 80% acc with mod cues. Clinician educated on AAC use for communication in settings/with people that are relevant to Pt with appropriate and simple phrases. Plan to focus on role-playing to continue educating Pt on how to use AAC device.  PATIENT EDUCATION: Education details: Use AAC device for communication, audio output should remain on so that pt can use device for communication Person educated: Patient and sister Education method: Explanation,  Demonstration, and Verbal cues Education comprehension: verbalized understanding, verbal cues required, and needs further education   SLP Short Term Goals - 06/20/21 1333       SLP SHORT TERM GOAL #1   Title Pt will establish external aid for functional recall and bring  to >75% of therapy sessions.    Time 6    Period Weeks    Status Achieved    Target Date 06/20/21      SLP SHORT TERM GOAL #2   Title Patient will generate sentence responses to simple questions >90% accuracy with mod cues.    Baseline 06/19/21: 75% with moderate cues    Time 6    Period Weeks    Status Partially Met    Target Date 06/20/21      SLP SHORT TERM GOAL #3   Title Pt will engage in simple conversation 3-5 minutes with supported conversation strategies or visual aid if necessary.    Time 6    Period Weeks    Status Achieved    Target Date 06/20/21              SLP Long Term Goals - 06/20/21 1335       SLP LONG TERM GOAL #1   Title Pt will use external aid for functional recall of completed/upcoming activities 80% accuracy with rare min A.    Time 12    Period Weeks    Status On-going      SLP LONG TERM GOAL #2   Title Patient will engage in simple-mod complex conversation for 5-8 minutes about a topic of interest, with supported conversation or use of visual aids if needed.    Time 12    Period Weeks    Status On-going      SLP LONG TERM GOAL #3   Title Patient will demonstrate knowledge of appropriate activities to support language function outside of ST with assistance from family.    Time 12    Period Weeks    Status On-going              Plan - 07/04/21 1810     Clinical Impression Statement Patient presents with moderate-severe cognitive communication and moderate language impairments; recent neuropsychological evaluation describes symptoms of non-fluent PPA as well as receptive deficits. Pt has had limited opportunities to use device at home as sister has been working on Risk manager. Suggested some functional scenarios to practice using device at home. Pt continues to improve in scanning and selecting choices on device with min to mod cues. Instructed Pt to begin using AAC device at home to request groceries. Continue skilled ST with focus on  functional communication and caregiver training to maximize communication abilities and support independence/quality of life.    Speech Therapy Frequency 1x /week    Duration 12 weeks   Frequency decreased to 1x per week with plan to continue over longer duration due to progressive language decline   Treatment/Interventions Language facilitation;Environmental controls;SLP instruction and feedback;Compensatory techniques;Functional tasks;Compensatory strategies;Internal/external aids;Multimodal communcation approach;Patient/family education    Potential to Achieve Goals Good   -fair   Potential Considerations Ability to learn/carryover information;Previous level of function;Severity of impairments              Glennie Hawk Student SLP

## 2021-08-02 ENCOUNTER — Encounter: Payer: Medicare HMO | Admitting: Speech Pathology

## 2021-08-03 ENCOUNTER — Ambulatory Visit: Payer: Medicare HMO | Admitting: Physician Assistant

## 2021-08-03 LAB — MAYO MISC ORDER 3

## 2021-08-07 ENCOUNTER — Encounter: Payer: Medicare HMO | Admitting: Speech Pathology

## 2021-08-07 ENCOUNTER — Ambulatory Visit: Payer: Medicare HMO | Admitting: Speech Pathology

## 2021-08-07 DIAGNOSIS — R4701 Aphasia: Secondary | ICD-10-CM | POA: Diagnosis not present

## 2021-08-07 DIAGNOSIS — R41841 Cognitive communication deficit: Secondary | ICD-10-CM

## 2021-08-08 ENCOUNTER — Encounter: Payer: Medicare HMO | Admitting: Speech Pathology

## 2021-08-08 NOTE — Addendum Note (Signed)
Addended by: Arlana Lindau on: 08/08/2021 11:57 AM   Modules accepted: Orders

## 2021-08-08 NOTE — Therapy (Signed)
OUTPATIENT SPEECH LANGUAGE PATHOLOGY TREATMENT NOTE AND RECERTIFICATION   Patient Name: Chad Stone MRN: [id number] DOB:[birth year], 51 y.o., male Today's Date: 08/08/2021  PCP: Fulton Reek, MD REFERRING PROVIDER: Sharene Butters, PA-C   End of Session - 08/07/21 0906     Visit Number 17    Number of Visits 30    Date for SLP Re-Evaluation 11/05/21    Authorization Type [employer]    Authorization - Visit Number 7    Progress Note Due on Visit 10    SLP Start Time 1600    SLP Stop Time  1700    SLP Time Calculation (min) 60 min    Activity Tolerance Patient tolerated treatment well               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   Febrile seizure    87 months old   Frontotemporal dementia without behavioral disturbance 06/07/2021   PPA presentation, 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 but also ongoing semantic impairment   Hyperlipidemia, mixed 06/05/2015   Nephrolithiasis    OSA (obstructive sleep apnea)    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   Past Surgical History:  Procedure Laterality Date   LITHOTRIPSY     Patient Active Problem List   Diagnosis Date Noted   Frontotemporal dementia without behavioral disturbance 06/07/2021   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   Cerebral atrophy 12/14/2018   OSA (obstructive sleep apnea) 05/20/2018   Benign essential hypertension 06/05/2015   Hyperlipidemia, mixed 06/05/2015   Nephrolithiasis 05/20/2013    ONSET DATE: 05/01/21 referral date; cognitive impairments noted in 2020   REFERRING DIAG: Frontotemporal dementia    HPI:  Chad Stone is a 51 y.o. year old RH male with 3 year history of memory loss, initially followed by neurology at [facility] clinic for FTD, referred for cognitive-linguistic evaluation by Sharene Butters, PA-C. Pt had neuropsychological evaluation on 06/07/2021 findings of likely  frontotemporal dementia (FTD), with noted symptoms of language impairment (PPA), with both expressive and receptive language deficits. PMHx also includes hypertension, hyperlipidemia, glaucoma, depression, 1 cm L frontal meningioma, migraine headaches, OSA on CPAP.    THERAPY DIAG:  Aphasia  Cognitive communication deficit  Rationale for Evaluation and Treatment Rehabilitation  SUBJECTIVE: Pt had AAC device this week to explore  Pt accompanied by: family member (sister Sharyn Lull)   PAIN:  Are you having pain? Yes: NPRS scale: 1/10 Pain location: back      OBJECTIVE:   TODAY'S TREATMENT: Per SLP request, Pt used AAC device during the week to order meal at restaurant. SLP facilitated using device during session prompting to identify folders; Pt identified 6 folders with min to mod cues. Pt scrolled 1x with min cues ~3x with mod cues. SLP provided additional instruction and models of scanning folders by reading titles to find desired folder with min to mod cues. SLP prompted Pt to use "Speech" folder to facilitate conversation. Pt used icons to initiate conversation and respond with min cues (greetings, rating pain). SLP role-played scenarios with folders groceries, church, pets, and about me; Pt used icons to respond and initiate conversation with min to mod cues (events from last weekend, stories about pets, introductions, requesting groceries).   PATIENT EDUCATION: Education details: Using AAC device for communication in different settings Person educated: Patient and sister Education method: Explanation, Demonstration, and Verbal cues Education comprehension: verbalized understanding, verbal cues required, and needs further education  SLP Short Term Goals - 08/08/21 1131       SLP SHORT TERM GOAL #1   Title Pt will establish external aid for functional recall and bring to >75% of therapy sessions.    Time 6    Period Weeks    Status Achieved    Target Date 06/20/21       SLP SHORT TERM GOAL #2   Title Patient will generate sentence responses to simple questions >90% accuracy with mod cues.    Baseline 06/19/21: 75% with moderate cues    Time 6    Period Weeks    Status Partially Met    Target Date 06/20/21      SLP SHORT TERM GOAL #3   Title Pt will engage in simple conversation 3-5 minutes with supported conversation strategies or visual aid if necessary.    Time 6    Period Weeks    Status Achieved    Target Date 06/20/21      SLP SHORT TERM GOAL #4   Title Pt will make simple requests (restaurant orders, grocery requests, bathroom) using AAC device with min-mod assistance from communication partner, 90% of opportunities.    Time 6    Period Weeks    Status New    Target Date 09/18/21      SLP SHORT TERM GOAL #5   Title Pt will demonstrate basic navigation of AAC device (power, folder access, scrolling) with min assist from communication partner, 80% of opportunities.    Time 6    Period Weeks    Status New    Target Date 09/18/21              SLP Long Term Goals - 08/08/21 1130       SLP LONG TERM GOAL #1   Title Pt will use external aid for functional recall of completed/upcoming activities 80% accuracy with rare min A.    Time 12    Period Weeks    Status Achieved      SLP LONG TERM GOAL #2   Title Patient will engage in simple conversation for 3-5 minutes about a topic of interest, with supported conversation or use of visual aids/AAC if needed.    Time 12    Period Weeks    Status Revised    Target Date 11/05/21      SLP LONG TERM GOAL #3   Title Patient will demonstrate knowledge of appropriate activities to support language function outside of ST with assistance from family.    Time 12    Period Weeks    Status Achieved      SLP LONG TERM GOAL #4   Title Pt will initiate communication exchange using AAC device outside of ST room over 3 session per caregiver report.    Time 12    Period Weeks    Status New    Target  Date 11/05/21      SLP LONG TERM GOAL #5   Title Pt will navigate through 2+ folders to access common needs/requests with min cues from communication partner in 80% of opportunities.    Time 12    Period Weeks    Status New    Target Date 11/05/21              Plan - 07/04/21 1810     Clinical Impression Statement Patient presents with moderate-severe cognitive communication and moderate language impairments; recent neuropsychological evaluation describes symptoms of non-fluent PPA as well as receptive  deficits. Pt had device this week to explore; noted to be more comfortable interacting with folders and icons. Pt continues to be eager to use device in conversation and as a visual aid for spoken communication. Pt noted to improve scanning after instruction with min to mod cues. An increase in spontaneous verbal output also noted when using the device. Provided education on how communication partner can use device to support Pt's communication. Continue skilled ST with focus on functional communication and caregiver training to maximize communication abilities and support independence/quality of life. Recertification completed today with goals revised and updated.   Speech Therapy Frequency 1x /week    Duration 12 weeks   Frequency decreased to 1x per week with plan to continue over longer duration due to progressive language decline   Treatment/Interventions Language facilitation;Environmental controls;SLP instruction and feedback;Compensatory techniques;Functional tasks;Compensatory strategies;Internal/external aids;Multimodal communcation approach;Patient/family education    Potential to Achieve Goals Good   -fair   Potential Considerations Ability to learn/carryover information;Previous level of function;Severity of impairments              Glennie Hawk Student SLP

## 2021-08-13 ENCOUNTER — Ambulatory Visit: Payer: Medicare HMO | Admitting: Speech Pathology

## 2021-08-13 ENCOUNTER — Encounter: Payer: Medicare HMO | Admitting: Speech Pathology

## 2021-08-13 DIAGNOSIS — R41841 Cognitive communication deficit: Secondary | ICD-10-CM

## 2021-08-13 DIAGNOSIS — R4701 Aphasia: Secondary | ICD-10-CM | POA: Diagnosis not present

## 2021-08-13 NOTE — Therapy (Signed)
OUTPATIENT SPEECH LANGUAGE PATHOLOGY TREATMENT NOTE AND RECERTIFICATION   Patient Name: Chad Stone MRN: [id number] DOB:1970/05/29, 51 y.o., male Today's Date: 08/13/2021  PCP: Fulton Reek, MD REFERRING PROVIDER: Sharene Butters, PA-C   End of Session - 08/13/21 1704     Visit Number 18    Number of Visits 30    Date for SLP Re-Evaluation 11/05/21    Authorization Type [employer]    Authorization - Visit Number 7    Progress Note Due on Visit 10    SLP Start Time 1600    SLP Stop Time  1700    SLP Time Calculation (min) 60 min    Activity Tolerance Patient tolerated treatment well               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   Febrile seizure    67 months old   Frontotemporal dementia without behavioral disturbance 06/07/2021   PPA presentation, 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 but also ongoing semantic impairment   Hyperlipidemia, mixed 06/05/2015   Nephrolithiasis    OSA (obstructive sleep apnea)    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   Past Surgical History:  Procedure Laterality Date   LITHOTRIPSY     Patient Active Problem List   Diagnosis Date Noted   Frontotemporal dementia without behavioral disturbance 06/07/2021   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   Cerebral atrophy 12/14/2018   OSA (obstructive sleep apnea) 05/20/2018   Benign essential hypertension 06/05/2015   Hyperlipidemia, mixed 06/05/2015   Nephrolithiasis 05/20/2013    ONSET DATE: 05/01/21 referral date; cognitive impairments noted in 2020   REFERRING DIAG: Frontotemporal dementia    HPI: [name] is a 51 y.o. year old RH male with 3 year history of memory loss, initially followed by neurology at [facility] clinic for FTD, referred for cognitive-linguistic evaluation by Sharene Butters, PA-C. Pt had neuropsychological evaluation on 06/07/2021 findings of likely  frontotemporal dementia (FTD), with noted symptoms of language impairment (PPA), with both expressive and receptive language deficits. PMHx also includes hypertension, hyperlipidemia, glaucoma, depression, 1 cm L frontal meningioma, migraine headaches, OSA on CPAP.    THERAPY DIAG:  Aphasia  Cognitive communication deficit  Rationale for Evaluation and Treatment Rehabilitation  SUBJECTIVE: "Nothing", pt re: weekend, initially  Pt accompanied by: family member Mother   PAIN:  Are you having pain? Yes: NPRS scale: 1/10 Pain location: back      OBJECTIVE:   TODAY'S TREATMENT: SLP used Lingraphica device with pt to stimulate simple conversation. Pt able to provide more details about his weekend with SLP assisting by navigating to topic. Pt able to initiate conversation and provide details about weekend gathering with friends/family, both by interacting with icons and reviewing images/text to stimulate spontaneous responses. Subsequently, from home page, pt navigated to appropriate sections when SLP raised topics (grocery shopping, restaurants, emergencies). Role played ordering scenarios and calling for help with pt selecting appropriate icons to make requests/give information 80% acc. At end of session when pt appeared uncomfortable and SLP asked if he was OK, pt selected "I need to use the bathroom." SLP praised pt for communicating this and encouraged him to make the request sooner during the session if needed.  PATIENT EDUCATION: Education details: Using AAC device for communication in different settings Person educated: Patient and sister Education method: Explanation, Demonstration, and Verbal cues Education comprehension: verbalized understanding, verbal cues required, and needs further education  SLP Short Term Goals - 08/08/21 1131       SLP SHORT TERM GOAL #1   Title Pt will establish external aid for functional recall and bring to >75% of therapy sessions.    Time 6     Period Weeks    Status Achieved    Target Date 06/20/21      SLP SHORT TERM GOAL #2   Title Patient will generate sentence responses to simple questions >90% accuracy with mod cues.    Baseline 06/19/21: 75% with moderate cues    Time 6    Period Weeks    Status Partially Met    Target Date 06/20/21      SLP SHORT TERM GOAL #3   Title Pt will engage in simple conversation 3-5 minutes with supported conversation strategies or visual aid if necessary.    Time 6    Period Weeks    Status Achieved    Target Date 06/20/21      SLP SHORT TERM GOAL #4   Title Pt will make simple requests (restaurant orders, grocery requests, bathroom) using AAC device with min-mod assistance from communication partner, 90% of opportunities.    Time 6    Period Weeks    Status New    Target Date 09/18/21      SLP SHORT TERM GOAL #5   Title Pt will demonstrate basic navigation of AAC device (power, folder access, scrolling) with min assist from communication partner, 80% of opportunities.    Time 6    Period Weeks    Status New    Target Date 09/18/21              SLP Long Term Goals - 08/08/21 1130       SLP LONG TERM GOAL #1   Title Pt will use external aid for functional recall of completed/upcoming activities 80% accuracy with rare min A.    Time 12    Period Weeks    Status Achieved      SLP LONG TERM GOAL #2   Title Patient will engage in simple conversation for 3-5 minutes about a topic of interest, with supported conversation or use of visual aids/AAC if needed.    Time 12    Period Weeks    Status Revised    Target Date 11/05/21      SLP LONG TERM GOAL #3   Title Patient will demonstrate knowledge of appropriate activities to support language function outside of ST with assistance from family.    Time 12    Period Weeks    Status Achieved      SLP LONG TERM GOAL #4   Title Pt will initiate communication exchange using AAC device outside of ST room over 3 session per  caregiver report.    Time 12    Period Weeks    Status New    Target Date 11/05/21      SLP LONG TERM GOAL #5   Title Pt will navigate through 2+ folders to access common needs/requests with min cues from communication partner in 80% of opportunities.    Time 12    Period Weeks    Status New    Target Date 11/05/21              Plan - 07/04/21 1810     Clinical Impression Statement Patient presents with moderate-severe cognitive communication and moderate language impairments; recent neuropsychological evaluation describes symptoms of non-fluent PPA as well as receptive  deficits. Pt continues to improve ability to navigate, categorize and locate items on his trial device. An increase in spontaneous verbal output also noted when using the device. Provided education on how communication partner can use device to support Pt's communication. Continue skilled ST with focus on functional communication and caregiver training to maximize communication abilities and support independence/quality of life. Recertification completed today with goals revised and updated.   Speech Therapy Frequency 1x /week    Duration 12 weeks   Frequency decreased to 1x per week with plan to continue over longer duration due to progressive language decline   Treatment/Interventions Language facilitation;Environmental controls;SLP instruction and feedback;Compensatory techniques;Functional tasks;Compensatory strategies;Internal/external aids;Multimodal communcation approach;Patient/family education    Potential to Achieve Goals Good   -fair   Potential Considerations Ability to learn/carryover information;Previous level of function;Severity of impairments             Deneise Lever, MS, Actor [phone]

## 2021-08-17 ENCOUNTER — Ambulatory Visit: Payer: Medicare HMO | Attending: Specialist

## 2021-08-17 DIAGNOSIS — G4761 Periodic limb movement disorder: Secondary | ICD-10-CM | POA: Insufficient documentation

## 2021-08-17 DIAGNOSIS — G4733 Obstructive sleep apnea (adult) (pediatric): Secondary | ICD-10-CM | POA: Diagnosis present

## 2021-08-21 ENCOUNTER — Ambulatory Visit: Payer: Medicare HMO | Attending: Physician Assistant | Admitting: Speech Pathology

## 2021-08-21 DIAGNOSIS — R41841 Cognitive communication deficit: Secondary | ICD-10-CM | POA: Insufficient documentation

## 2021-08-21 DIAGNOSIS — R4701 Aphasia: Secondary | ICD-10-CM | POA: Diagnosis present

## 2021-08-22 NOTE — Therapy (Signed)
OUTPATIENT SPEECH LANGUAGE PATHOLOGY TREATMENT NOTE   Patient Name: Chad Stone MRN: [id number] DOB:[birth year], 51 y.o., male Today's Date: 08/22/2021  PCP: Fulton Reek, MD REFERRING PROVIDER: Sharene Butters, PA-C   End of Session - 08/22/21 1324     Visit Number 19    Number of Visits 30    Date for SLP Re-Evaluation 11/05/21    Authorization Type [employer]    Authorization - Visit Number 9    Progress Note Due on Visit 10    SLP Start Time 1605    SLP Stop Time  5681    SLP Time Calculation (min) 70 min    Activity Tolerance Patient tolerated treatment well               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   Febrile seizure    53 months old   Frontotemporal dementia without behavioral disturbance 06/07/2021   PPA presentation, 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 but also ongoing semantic impairment   Hyperlipidemia, mixed 06/05/2015   Nephrolithiasis    OSA (obstructive sleep apnea)    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 year old RH male with 3 year history of memory loss, initially followed by neurology at [facility] clinic for FTD, referred for cognitive-linguistic evaluation by Sharene Butters, PA-C. Pt had neuropsychological evaluation on 06/07/2021 findings of likely frontotemporal dementia (FTD),  with noted symptoms of language impairment (PPA), with both expressive and receptive language deficits. PMHx also includes hypertension, hyperlipidemia, glaucoma, depression, 1 cm L frontal meningioma, migraine headaches, OSA on CPAP.    THERAPY DIAG:  Cognitive communication deficit  Aphasia  Rationale for Evaluation and Treatment Rehabilitation  SUBJECTIVE: Pt arrives with sister  Pt accompanied by: family member Michelle   PAIN:  Are you having pain? Yes: NPRS scale: 1/10 Pain location: back      OBJECTIVE:   TODAY'S TREATMENT: SLP assisted with installing update to pt's device to add additional customization. When pt exhibited signs of discomfort, he required mod cue from SLP to initiate use of device to take a bathroom break. [name] required min cues to navigate 2 folder selections to answer questions re: feelings and pain. SLP educated on use of personally relevant vs abstract images to improve pt comprehension/ familiarity with icons. Pt participated in icon creation by modeling facial expressions for feelings icons with min cues.   PATIENT EDUCATION: Education details: Use AAC to talk about feelings and make requests this week Person educated: Patient and sister Education method: Explanation, Demonstration, and Verbal cues Education comprehension: verbalized understanding, verbal cues required, and needs further education   SLP Short Term Goals - 08/08/21 1131       SLP SHORT TERM GOAL #1   Title Pt will establish external aid for functional recall and bring to >75% of therapy sessions.    Time 6  Period Weeks    Status Achieved    Target Date 06/20/21      SLP SHORT TERM GOAL #2   Title Patient will generate sentence responses to simple questions >90% accuracy with mod cues.    Baseline 06/19/21: 75% with moderate cues    Time 6    Period Weeks    Status Partially Met    Target Date 06/20/21      SLP SHORT TERM GOAL #3   Title Pt will engage in simple  conversation 3-5 minutes with supported conversation strategies or visual aid if necessary.    Time 6    Period Weeks    Status Achieved    Target Date 06/20/21      SLP SHORT TERM GOAL #4   Title Pt will make simple requests (restaurant orders, grocery requests, bathroom) using AAC device with min-mod assistance from communication partner, 90% of opportunities.    Time 6    Period Weeks    Status New    Target Date 09/18/21      SLP SHORT TERM GOAL #5   Title Pt will demonstrate basic navigation of AAC device (power, folder access, scrolling) with min assist from communication partner, 80% of opportunities.    Time 6    Period Weeks    Status New    Target Date 09/18/21              SLP Long Term Goals - 08/08/21 1130       SLP LONG TERM GOAL #1   Title Pt will use external aid for functional recall of completed/upcoming activities 80% accuracy with rare min A.    Time 12    Period Weeks    Status Achieved      SLP LONG TERM GOAL #2   Title Patient will engage in simple conversation for 3-5 minutes about a topic of interest, with supported conversation or use of visual aids/AAC if needed.    Time 12    Period Weeks    Status Revised    Target Date 11/05/21      SLP LONG TERM GOAL #3   Title Patient will demonstrate knowledge of appropriate activities to support language function outside of ST with assistance from family.    Time 12    Period Weeks    Status Achieved      SLP LONG TERM GOAL #4   Title Pt will initiate communication exchange using AAC device outside of ST room over 3 session per caregiver report.    Time 12    Period Weeks    Status New    Target Date 11/05/21      SLP LONG TERM GOAL #5   Title Pt will navigate through 2+ folders to access common needs/requests with min cues from communication partner in 80% of opportunities.    Time 12    Period Weeks    Status New    Target Date 11/05/21              Plan - 07/04/21 1810      Clinical Impression Statement Patient presents with moderate-severe cognitive communication and moderate language impairments; recent neuropsychological evaluation describes symptoms of non-fluent PPA as well as receptive deficits. Pt continues to improve ability to navigate, categorize and locate items on his trial device. An increase in spontaneous verbal output also noted when using the device. Continue to provide education on how communication partner can use device to support Pt's communication. Continue  skilled ST with focus on functional communication and caregiver training to maximize communication abilities and support independence/quality of life.    Speech Therapy Frequency 1x /week    Duration 12 weeks   Frequency decreased to 1x per week with plan to continue over longer duration due to progressive language decline   Treatment/Interventions Language facilitation;Environmental controls;SLP instruction and feedback;Compensatory techniques;Functional tasks;Compensatory strategies;Internal/external aids;Multimodal communcation approach;Patient/family education    Potential to Achieve Goals Good   -fair   Potential Considerations Ability to learn/carryover information;Previous level of function;Severity of impairments             Deneise Lever, MS, Actor [phone]

## 2021-08-23 LAB — MAYO MISCELLANEOUS 2: PRICE:: 673

## 2021-08-23 LAB — MAYO MISC ORDER: PRICE:: 1691.3

## 2021-08-23 LAB — HERPES SIMPLEX VIRUS, TYPE 1 AND 2 DNA,QUAL,RT PCR
HSV 1 DNA: NOT DETECTED
HSV 2 DNA: NOT DETECTED

## 2021-08-23 LAB — CSF CELL COUNT WITH DIFFERENTIAL
RBC Count, CSF: 0 cells/uL
TOTAL NUCLEATED CELL: 1 cells/uL (ref 0–5)

## 2021-08-23 LAB — CNS IGG SYNTHESIS RATE, CSF+BLOOD
Albumin Serum: 4.6 g/dL (ref 3.6–5.1)
Albumin, CSF: 36.9 mg/dL (ref 8.0–42.0)
CNS-IgG Synthesis Rate: -0.8 mg/24 h (ref <=3.3)
IgG (Immunoglobin G), Serum: 994 mg/dL (ref 600–1640)
IgG Total CSF: 4.1 mg/dL (ref 0.8–7.7)
IgG-Index: 0.51 (ref <0.70)

## 2021-08-23 LAB — PROTEIN, CSF 14-3-3 (PRION DISEASE)
14-3-3 PROTEIN (CSF)++: 5300 AU/ml — ABNORMAL HIGH (ref 30–1999)
EST PROB PRION DIS IN PATIENT: <0.2 %
RT-QUIC (CSF)*: NEGATIVE
T-TAU PROTEIN (CSF)++: 432 pg/ml (ref 0–1149)

## 2021-08-23 LAB — MAYO MISC ORDER 2: PRICE:: 1240

## 2021-08-23 LAB — CSF CULTURE W GRAM STAIN
MICRO NUMBER:: 13637034
Result:: NO GROWTH
SPECIMEN QUALITY:: ADEQUATE

## 2021-08-23 LAB — FUNGUS CULTURE W SMEAR
CULTURE:: NO GROWTH
MICRO NUMBER:: 13637033
SMEAR:: NONE SEEN
SPECIMEN QUALITY:: ADEQUATE

## 2021-08-23 LAB — CRYPTOCOCCAL AG, LTX SCR RFLX TITER
Cryptococcal Ag Screen: NOT DETECTED
MICRO NUMBER:: 13637032
SPECIMEN QUALITY:: ADEQUATE

## 2021-08-23 LAB — PROTEIN, CSF: Total Protein, CSF: 62 mg/dL — ABNORMAL HIGH (ref 15–45)

## 2021-08-23 LAB — MYELIN BASIC PROTEIN, CSF: Myelin Basic Protein: <2 mcg/L (ref <=4.0)

## 2021-08-23 LAB — GLUCOSE, CSF: Glucose, CSF: 56 mg/dL (ref 40–80)

## 2021-08-23 LAB — ANGIOTENSIN CONVERTING ENZYME, CSF: ANGIOTENSIN CONVERTING ENZYME ( ACE) CSF: 8 U/L (ref <=15)

## 2021-08-23 LAB — OLIGOCLONAL BANDS, CSF + SERM: Oligo Bands: ABSENT

## 2021-08-23 LAB — VDRL, CSF: VDRL Quant, CSF: NONREACTIVE

## 2021-08-28 ENCOUNTER — Ambulatory Visit: Payer: Medicare HMO | Admitting: Speech Pathology

## 2021-08-28 DIAGNOSIS — R41841 Cognitive communication deficit: Secondary | ICD-10-CM | POA: Diagnosis not present

## 2021-08-28 DIAGNOSIS — R4701 Aphasia: Secondary | ICD-10-CM

## 2021-08-28 NOTE — Therapy (Unsigned)
OUTPATIENT SPEECH LANGUAGE PATHOLOGY TREATMENT AND PROGRESS NOTE   Patient Name: Chad Stone MRN: [id number] DOB:[birth year], 51 y.o., male Today's Date: 08/29/2021   Speech Therapy Progress Note  Dates of Reporting Period: 06/19/2021 to 08/28/2021  Objective: Patient has been seen for 10 speech therapy sessions this reporting period targeting aphasia and cognitive communication impairments. Patient is making progress toward LTGs and fully met 1/2, partially met 1/2 STGs this reporting period. See skilled intervention, clinical impressions, and goals below for details.  PCP: Fulton Reek, MD REFERRING PROVIDER: Sharene Butters, PA-C   End of Session - 08/28/21 1755     Visit Number 20    Number of Visits 30    Date for SLP Re-Evaluation 11/05/21    Authorization Type [employer] Medicare    Authorization - Visit Number 10    Progress Note Due on Visit 10    SLP Start Time 1600    SLP Stop Time  1700    SLP Time Calculation (min) 60 min    Activity Tolerance Patient tolerated treatment well               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   Febrile seizure    39 months old   Frontotemporal dementia without behavioral disturbance 06/07/2021   PPA presentation, 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 but also ongoing semantic impairment   Hyperlipidemia, mixed 06/05/2015   Nephrolithiasis    OSA (obstructive sleep apnea)    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   Past Surgical History:  Procedure Laterality Date   LITHOTRIPSY     Patient Active Problem List   Diagnosis Date Noted   Frontotemporal dementia without behavioral disturbance 06/07/2021   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   Cerebral atrophy 12/14/2018   OSA (obstructive sleep apnea) 05/20/2018   Benign essential hypertension 06/05/2015   Hyperlipidemia, mixed 06/05/2015   Nephrolithiasis 05/20/2013    ONSET DATE:  05/01/21 referral date; cognitive impairments noted in 2020   REFERRING DIAG: Frontotemporal dementia    HPI: Chad Stone is a 51 y.o. year old RH male with 3 year history of memory loss, initially followed by neurology at [facility] clinic for FTD, referred for cognitive-linguistic evaluation by Sharene Butters, PA-C. Pt had neuropsychological evaluation on 06/07/2021 findings of likely frontotemporal dementia (FTD), with noted symptoms of language impairment (PPA), with both expressive and receptive language deficits. PMHx also includes hypertension, hyperlipidemia, glaucoma, depression, 1 cm L frontal meningioma, migraine headaches, OSA on CPAP.    THERAPY DIAG:  Cognitive communication deficit  Aphasia  Rationale for Evaluation and Treatment Rehabilitation  SUBJECTIVE: Patient agrees to return SGD loaner and await permanent device  Pt accompanied by: family member Michelle   PAIN:  Are you having pain? No Pain location: back      OBJECTIVE:   TODAY'S TREATMENT: Patient required min-mod cues for navigating his device; rare min cues for folder access, mod cues for scrolling. SLP worked with pt and sister to modify layout to reduce need for scrolling (reducing size and number of icons present on each screen. Worked with pt and sister on adjusting device pronunciation and modifying images to be more salient/less abstract for pt (places, for ex). Pt responded to simple questions by navigating to correct folder with mildly extended time (people, pets, memories, church) and selecting icons in response to questions. Initiated icon combination following model (I like, I am).   PATIENT EDUCATION:  Education details: therapy hold pending arrival of permanent SGD Person educated: Patient and sister Education method: Explanation, Demonstration, and Verbal cues Education comprehension: verbalized understanding, verbal cues required, and needs further education   SLP Short Term Goals -  08/29/21 0837       SLP SHORT TERM GOAL #1   Title Pt will establish external aid for functional recall and bring to >75% of therapy sessions.    Time 6    Period Weeks    Status Achieved    Target Date 06/20/21      SLP SHORT TERM GOAL #2   Title Patient will generate sentence responses to simple questions >90% accuracy with mod cues.    Baseline 06/19/21: 75% with moderate cues    Time 6    Period Weeks    Status Partially Met    Target Date 06/20/21      SLP SHORT TERM GOAL #3   Title Pt will engage in simple conversation 3-5 minutes with supported conversation strategies or visual aid if necessary.    Time 6    Period Weeks    Status Achieved    Target Date 06/20/21      SLP SHORT TERM GOAL #4   Title Pt will make simple requests (restaurant orders, grocery requests, bathroom) using AAC device with min-mod assistance from communication partner, 90% of opportunities.    Time 6    Period Weeks    Status Achieved    Target Date 09/18/21      SLP SHORT TERM GOAL #5   Title Pt will demonstrate basic navigation of AAC device (power, folder access, scrolling) with min assist from communication partner, 80% of opportunities.    Baseline 08/28/21: occasional min-mod cues (min for folder access, mod for power and scrolling)    Time 6    Period Weeks    Status Partially Met    Target Date 09/18/21              SLP Long Term Goals - 08/29/21 8768       SLP LONG TERM GOAL #1   Title Pt will use external aid for functional recall of completed/upcoming activities 80% accuracy with rare min A.    Time 12    Period Weeks    Status Achieved      SLP LONG TERM GOAL #2   Title Patient will engage in simple conversation for 3-5 minutes about a topic of interest, with supported conversation or use of visual aids/AAC if needed.    Time 12    Period Weeks    Status Revised      SLP LONG TERM GOAL #3   Title Patient will demonstrate knowledge of appropriate activities to support  language function outside of ST with assistance from family.    Time 12    Period Weeks    Status Achieved      SLP LONG TERM GOAL #4   Title Pt will initiate communication exchange using AAC device outside of ST room over 3 session per caregiver report.    Time 12    Period Weeks    Status On-going      SLP LONG TERM GOAL #5   Title Pt will navigate through 2+ folders to access common needs/requests with min cues from communication partner in 80% of opportunities.    Time 12    Period Weeks    Status On-going  Plan - 07/04/21 1810     Clinical Impression Statement Patient presents with moderate-severe cognitive communication and moderate language impairments; recent neuropsychological evaluation describes symptoms of non-fluent PPA as well as receptive deficits. Pt continues to improve ability to navigate, categorize and locate items on his trial device. An increase in spontaneous verbal output also noted when using the device. Continue to provide education on how communication partner can use device to support Pt's communication and adapt with further declines in language ability. Recommend permanent AAC device to maximize communication; pt and sister in agreement. Will place pt on hold for ~4 weeks to allow for processing of paperwork/insurance and for permanent device to arrive. Continue skilled ST with focus on functional communication and caregiver training to maximize communication abilities and support independence/quality of life.    Speech Therapy Frequency 1x /week    Duration 12 weeks   Will place on hold for 4 weeks and await arrival of pt's permanent SGD   Treatment/Interventions Language facilitation;Environmental controls;SLP instruction and feedback;Compensatory techniques;Functional tasks;Compensatory strategies;Internal/external aids;Multimodal communcation approach;Patient/family education    Potential to Achieve Goals Good   -fair   Potential  Considerations Ability to learn/carryover information;Previous level of function;Severity of impairments             Deneise Lever, MS, Actor [phone]

## 2021-09-04 ENCOUNTER — Encounter: Payer: Medicare HMO | Admitting: Speech Pathology

## 2021-09-11 ENCOUNTER — Encounter: Payer: Medicare HMO | Admitting: Speech Pathology

## 2021-09-18 ENCOUNTER — Encounter: Payer: Medicare HMO | Admitting: Speech Pathology

## 2021-09-20 ENCOUNTER — Encounter: Payer: Medicare HMO | Admitting: Speech Pathology

## 2021-09-24 ENCOUNTER — Telehealth: Payer: Self-pay | Admitting: Physician Assistant

## 2021-09-24 NOTE — Telephone Encounter (Signed)
Nashae from [facility] provider wanted to find out if our office received the order she faxed to [place] for this patient's speech device?

## 2021-09-24 NOTE — Telephone Encounter (Signed)
Yes, will refax again

## 2021-09-26 ENCOUNTER — Encounter: Payer: Medicare HMO | Admitting: Speech Pathology

## 2021-09-26 ENCOUNTER — Other Ambulatory Visit: Payer: Self-pay | Admitting: Internal Medicine

## 2021-09-26 DIAGNOSIS — R0602 Shortness of breath: Secondary | ICD-10-CM

## 2021-09-28 NOTE — Telephone Encounter (Signed)
Nashae from [facility] provider wanted to find out if our office received the order she faxed to [place] for this patient's speech device? She has not received it yet  Fax number [phone]  Her direct line [phone]

## 2021-09-28 NOTE — Telephone Encounter (Signed)
Left message on voicemail directly, will fax again.

## 2021-10-03 ENCOUNTER — Encounter: Payer: Medicare HMO | Admitting: Speech Pathology

## 2021-10-04 NOTE — Telephone Encounter (Signed)
Nashae from [facility] provider called again and received order but no visit notes, please send notes.

## 2021-10-04 NOTE — Telephone Encounter (Signed)
Faxed again. Completed

## 2021-10-10 ENCOUNTER — Ambulatory Visit: Payer: Medicare HMO | Attending: Physician Assistant | Admitting: Speech Pathology

## 2021-10-10 DIAGNOSIS — R4701 Aphasia: Secondary | ICD-10-CM | POA: Diagnosis present

## 2021-10-10 DIAGNOSIS — R41841 Cognitive communication deficit: Secondary | ICD-10-CM | POA: Diagnosis present

## 2021-10-11 NOTE — Therapy (Signed)
OUTPATIENT SPEECH LANGUAGE PATHOLOGY TREATMENT   Patient Name: Chad Stone MRN: [id number] DOB:03/29/1970, 51 y.o., male Today's Date: 10/11/2021    PCP: Fulton Reek, MD REFERRING PROVIDER: Sharene Butters, PA-C   End of Session - 10/10/21 1605     Visit Number 21    Number of Visits 30    Date for SLP Re-Evaluation 11/05/21    Authorization Type [employer]    Authorization - Visit Number 1    Progress Note Due on Visit 10    SLP Start Time 1600    SLP Stop Time  1700    SLP Time Calculation (min) 60 min    Activity Tolerance Patient tolerated treatment well               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   Febrile seizure    56 months old   Frontotemporal dementia without behavioral disturbance 06/07/2021   PPA presentation, 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 but also ongoing semantic impairment   Hyperlipidemia, mixed 06/05/2015   Nephrolithiasis    OSA (obstructive sleep apnea)    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   Past Surgical History:  Procedure Laterality Date   LITHOTRIPSY     Patient Active Problem List   Diagnosis Date Noted   Frontotemporal dementia without behavioral disturbance 06/07/2021   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   Cerebral atrophy 12/14/2018   OSA (obstructive sleep apnea) 05/20/2018   Benign essential hypertension 06/05/2015   Hyperlipidemia, mixed 06/05/2015   Nephrolithiasis 05/20/2013    ONSET DATE: 05/01/21 referral date; cognitive impairments noted in 2020   REFERRING DIAG: Frontotemporal dementia    HPI: SAYVION VIGEN is a 51 y.o. year old RH male with 3 year history of memory loss, initially followed by neurology at [facility] clinic for FTD, referred for cognitive-linguistic evaluation by Sharene Butters, PA-C. Pt had neuropsychological evaluation on 06/07/2021 findings of likely frontotemporal dementia (FTD),  with noted symptoms of language impairment (PPA), with both expressive and receptive language deficits. PMHx also includes hypertension, hyperlipidemia, glaucoma, depression, 1 cm L frontal meningioma, migraine headaches, OSA on CPAP.    THERAPY DIAG:  Cognitive communication deficit  Aphasia  Rationale for Evaluation and Treatment Rehabilitation  SUBJECTIVE: Patient exited bathroom with jeans unbuttoned and belt undone.  Pt accompanied by: family member Michelle   PAIN:  Are you having pain? 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 Patient navigated sections with encouragement, activated icons without cues when questions asked about relevant icons on page. Navigated through 2-3 menu choices with min-mod cues. With brief review, sister demonstrated ability to edit icons and record pt's voice for device output. Shifted focus to using device intentionally for communication. Programmed icons for pt to use at mother's house later to ask questions (what's for dinner, how are y'all?). Discussed continuing ST with focus on functional communication using device (and maximizing organization of icons for best pt access) and gradually reducing frequency. Plan to reduce frequency to 1x every other week in October and November, then 1x per month if additional sessions necessary.   PATIENT EDUCATION: Education details: using device for communication; opportunities Person educated: Patient and sister Education method: Explanation, Demonstration, and Verbal cues Education comprehension: verbalized understanding, verbal cues required, and needs further education   SLP Short Term Goals - 08/29/21 [phone]  SLP SHORT TERM GOAL #1   Title Pt will establish external aid for functional recall and bring to >75% of therapy sessions.    Time 6    Period Weeks    Status Achieved    Target Date 06/20/21      SLP SHORT TERM GOAL #2   Title Patient  will generate sentence responses to simple questions >90% accuracy with mod cues.    Baseline 06/19/21: 75% with moderate cues    Time 6    Period Weeks    Status Partially Met    Target Date 06/20/21      SLP SHORT TERM GOAL #3   Title Pt will engage in simple conversation 3-5 minutes with supported conversation strategies or visual aid if necessary.    Time 6    Period Weeks    Status Achieved    Target Date 06/20/21      SLP SHORT TERM GOAL #4   Title Pt will make simple requests (restaurant orders, grocery requests, bathroom) using AAC device with min-mod assistance from communication partner, 90% of opportunities.    Time 6    Period Weeks    Status Achieved    Target Date 09/18/21      SLP SHORT TERM GOAL #5   Title Pt will demonstrate basic navigation of AAC device (power, folder access, scrolling) with min assist from communication partner, 80% of opportunities.    Baseline 08/28/21: occasional min-mod cues (min for folder access, mod for power and scrolling)    Time 6    Period Weeks    Status Partially Met    Target Date 09/18/21              SLP Long Term Goals - 08/29/21 8891       SLP LONG TERM GOAL #1   Title Pt will use external aid for functional recall of completed/upcoming activities 80% accuracy with rare min A.    Time 12    Period Weeks    Status Achieved      SLP LONG TERM GOAL #2   Title Patient will engage in simple conversation for 3-5 minutes about a topic of interest, with supported conversation or use of visual aids/AAC if needed.    Time 12    Period Weeks    Status Revised      SLP LONG TERM GOAL #3   Title Patient will demonstrate knowledge of appropriate activities to support language function outside of ST with assistance from family.    Time 12    Period Weeks    Status Achieved      SLP LONG TERM GOAL #4   Title Pt will initiate communication exchange using AAC device outside of ST room over 3 session per caregiver report.     Time 12    Period Weeks    Status On-going      SLP LONG TERM GOAL #5   Title Pt will navigate through 2+ folders to access common needs/requests with min cues from communication partner in 80% of opportunities.    Time 12    Period Weeks    Status On-going              Plan - 07/04/21 1810     Clinical Impression Statement Patient presents with moderate-severe cognitive communication and moderate language impairments; recent neuropsychological evaluation describes symptoms of non-fluent PPA as well as receptive deficits. Patient received permanent SGD and continues to engage with device with caregiver  support. Continue to provide education on how communication partner can use device to support Pt's communication and adapt with further declines in language ability. Patient has been noted to have difficulty initiating requests to use the restroom, and forgetting basic privacy (leaving door open to public restroom, exiting restroom without refastening clothing). Plan to discuss using schedule and prompting for restroom use before and after outings. Continue skilled ST with focus on functional communication and caregiver training to maximize communication abilities and support independence/quality of life.    Speech Therapy Frequency 1x /week    Duration 12 weeks    Treatment/Interventions Language facilitation;Environmental controls;SLP instruction and feedback;Compensatory techniques;Functional tasks;Compensatory strategies;Internal/external aids;Multimodal communcation approach;Patient/family education    Potential to Achieve Goals Good   -fair   Potential Considerations Ability to learn/carryover information;Previous level of function;Severity of impairments             Deneise Lever, MS, Actor [phone]

## 2021-10-15 ENCOUNTER — Ambulatory Visit: Payer: Medicare HMO | Attending: Physician Assistant | Admitting: Speech Pathology

## 2021-10-15 DIAGNOSIS — R4701 Aphasia: Secondary | ICD-10-CM | POA: Insufficient documentation

## 2021-10-15 DIAGNOSIS — R41841 Cognitive communication deficit: Secondary | ICD-10-CM | POA: Diagnosis not present

## 2021-10-15 NOTE — Therapy (Signed)
OUTPATIENT SPEECH LANGUAGE PATHOLOGY TREATMENT   Patient Name: Chad Stone MRN: [id number] DOB:01-01-1971, 51 y.o., male Today's Date: 10/15/2021    PCP: Fulton Reek, MD REFERRING PROVIDER: Sharene Butters, PA-C   End of Session - 10/15/21 1602     Visit Number 22    Number of Visits 30    Date for SLP Re-Evaluation 11/05/21    Authorization Type [employer]    Authorization - Visit Number 2    Progress Note Due on Visit 10    SLP Start Time 1600    SLP Stop Time  1700    SLP Time Calculation (min) 60 min    Activity Tolerance Patient tolerated treatment well               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   Febrile seizure    55 months old   Frontotemporal dementia without behavioral disturbance 06/07/2021   PPA presentation, 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 but also ongoing semantic impairment   Hyperlipidemia, mixed 06/05/2015   Nephrolithiasis    OSA (obstructive sleep apnea)    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 year old RH male with 3 year history of memory loss, initially followed by neurology at [facility] for FTD, referred for cognitive-linguistic evaluation by Sharene Butters, PA-C. Pt had neuropsychological evaluation on 06/07/2021 findings of likely frontotemporal dementia (FTD),  with noted symptoms of language impairment (PPA), with both expressive and receptive language deficits. PMHx also includes hypertension, hyperlipidemia, glaucoma, depression, 1 cm L frontal meningioma, migraine headaches, OSA on CPAP.    THERAPY DIAG:  Cognitive communication deficit  Aphasia  Rationale for Evaluation and Treatment Rehabilitation  SUBJECTIVE: Arrives carrying SGD on strap.  Pt accompanied by: family member Mother   PAIN:  Are you having pain? No Pain location: back      OBJECTIVE:   TODAY'S TREATMENT: Patient reports using his device to ask "How are y'all?" And "What's for dinner?" at his mother's last week (once). Did not take his device to church. Education to pt/family on daily use of the device for communication exchanges. Role played communication exchanges at pt's home (requests for groceries, information gathering), parent's home, and church with min cues required.   PATIENT EDUCATION: Education details: using device for communication; opportunities Person educated: Patient and sister Education method: Explanation, Demonstration, and Verbal cues Education comprehension: verbalized understanding, verbal cues required, and needs further education   SLP Short Term Goals - 08/29/21 0837       SLP SHORT TERM GOAL #1   Title Pt will establish external aid for functional recall and bring to >75% of therapy sessions.    Time 6    Period Weeks    Status Achieved    Target Date 06/20/21      SLP SHORT TERM GOAL #2   Title Patient will generate sentence  responses to simple questions >90% accuracy with mod cues.    Baseline 06/19/21: 75% with moderate cues    Time 6    Period Weeks    Status Partially Met    Target Date 06/20/21      SLP SHORT TERM GOAL #3   Title Pt will engage in simple conversation 3-5 minutes with supported conversation strategies or visual aid if necessary.    Time 6    Period Weeks    Status Achieved    Target Date 06/20/21       SLP SHORT TERM GOAL #4   Title Pt will make simple requests (restaurant orders, grocery requests, bathroom) using AAC device with min-mod assistance from communication partner, 90% of opportunities.    Time 6    Period Weeks    Status Achieved    Target Date 09/18/21      SLP SHORT TERM GOAL #5   Title Pt will demonstrate basic navigation of AAC device (power, folder access, scrolling) with min assist from communication partner, 80% of opportunities.    Baseline 08/28/21: occasional min-mod cues (min for folder access, mod for power and scrolling)    Time 6    Period Weeks    Status Partially Met    Target Date 09/18/21              SLP Long Term Goals - 08/29/21 1975       SLP LONG TERM GOAL #1   Title Pt will use external aid for functional recall of completed/upcoming activities 80% accuracy with rare min A.    Time 12    Period Weeks    Status Achieved      SLP LONG TERM GOAL #2   Title Patient will engage in simple conversation for 3-5 minutes about a topic of interest, with supported conversation or use of visual aids/AAC if needed.    Time 12    Period Weeks    Status Revised      SLP LONG TERM GOAL #3   Title Patient will demonstrate knowledge of appropriate activities to support language function outside of ST with assistance from family.    Time 12    Period Weeks    Status Achieved      SLP LONG TERM GOAL #4   Title Pt will initiate communication exchange using AAC device outside of ST room over 3 session per caregiver report.    Time 12    Period Weeks    Status On-going      SLP LONG TERM GOAL #5   Title Pt will navigate through 2+ folders to access common needs/requests with min cues from communication partner in 80% of opportunities.    Time 12    Period Weeks    Status On-going              Plan - 07/04/21 1810     Clinical Impression Statement Patient presents with moderate-severe cognitive communication and moderate language  impairments; neuropsychological evaluation describes symptoms of non-fluent PPA as well as receptive deficits. Patient received permanent SGD and continues to engage with device with caregiver support. Continue to provide education on how communication partner can use device to support Pt's communication and adapt with further declines in language ability. Patient has been noted to have difficulty initiating requests to use the restroom, and forgetting basic privacy (leaving door open to public restroom, exiting restroom without refastening clothing). Plan to discuss using schedule and prompting for restroom use  before and after outings. Continue skilled ST with focus on functional communication and caregiver training to maximize communication abilities and support independence/quality of life.    Speech Therapy Frequency 1x /week    Duration 12 weeks    Treatment/Interventions Language facilitation;Environmental controls;SLP instruction and feedback;Compensatory techniques;Functional tasks;Compensatory strategies;Internal/external aids;Multimodal communcation approach;Patient/family education    Potential to Achieve Goals Good   -fair   Potential Considerations Ability to learn/carryover information;Previous level of function;Severity of impairments             Deneise Lever, MS, Actor [phone]

## 2021-10-22 ENCOUNTER — Ambulatory Visit: Payer: Medicare HMO | Admitting: Speech Pathology

## 2021-10-23 ENCOUNTER — Ambulatory Visit: Payer: Medicare HMO | Admitting: Speech Pathology

## 2021-10-23 DIAGNOSIS — R4701 Aphasia: Secondary | ICD-10-CM

## 2021-10-23 DIAGNOSIS — R41841 Cognitive communication deficit: Secondary | ICD-10-CM

## 2021-10-24 NOTE — Therapy (Signed)
OUTPATIENT SPEECH LANGUAGE PATHOLOGY TREATMENT   Patient Name: Chad Stone MRN: [id number] DOB:[birth year], 51 y.o., male Today's Date: 10/24/2021    PCP: Fulton Reek, MD REFERRING PROVIDER: Sharene Butters, PA-C   End of Session - 10/24/21 1435     Visit Number 23    Number of Visits 30    Date for SLP Re-Evaluation 11/05/21    Authorization Type [employer]    Authorization - Visit Number 3    Progress Note Due on Visit 10    Activity Tolerance Patient tolerated treatment well               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   Febrile seizure    53 months old   Frontotemporal dementia without behavioral disturbance 06/07/2021   PPA presentation, 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 but also ongoing semantic impairment   Hyperlipidemia, mixed 06/05/2015   Nephrolithiasis    OSA (obstructive sleep apnea)    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 year old RH male with 3 year history of memory loss, initially followed by neurology at [facility] for FTD, referred for cognitive-linguistic evaluation by Sharene Butters, PA-C. Pt had neuropsychological evaluation on 06/07/2021 findings of likely frontotemporal dementia (FTD), with noted symptoms of language impairment (PPA), with both expressive and receptive  language deficits. PMHx also includes hypertension, hyperlipidemia, glaucoma, depression, 1 cm L frontal meningioma, migraine headaches, OSA on CPAP.    THERAPY DIAG:  Aphasia  Cognitive communication deficit  Rationale for Evaluation and Treatment Rehabilitation  SUBJECTIVE: Arrives carrying SGD on strap.  Pt accompanied by: family member Mother   PAIN:  Are you having pain? No Pain location: back      OBJECTIVE:   TODAY'S TREATMENT: Mother reports pt used device at home some to ask questions (dinner, how are you?), but pt reports he forgot to take device to church. SLP educated that pt will need support from family/friends and repetition of using the device to communicate. Continue to reinforce taking device with him and using the device to ask questions and make requests with support. Added additional icons for functional communication  (requesting goat supplies, check in at front desk for therapy). Role played check in with front desk staff; pt required initial min-mod cues to locate initial folder (places).   PATIENT EDUCATION: Education details: using device for communication; opportunities Person educated: Patient and mother Education method: Explanation, Demonstration, and Verbal cues Education comprehension: verbalized understanding, verbal cues required, and needs further education   SLP Short Term Goals - 08/29/21 0837       SLP SHORT TERM GOAL #1   Title Pt will establish external aid for functional recall and bring to >75% of therapy sessions.    Time 6    Period Weeks    Status Achieved    Target Date 06/20/21  SLP SHORT TERM GOAL #2   Title Patient will generate sentence responses to simple questions >90% accuracy with mod cues.    Baseline 06/19/21: 75% with moderate cues    Time 6    Period Weeks    Status Partially Met    Target Date 06/20/21      SLP SHORT TERM GOAL #3   Title Pt will engage in simple conversation 3-5 minutes with supported  conversation strategies or visual aid if necessary.    Time 6    Period Weeks    Status Achieved    Target Date 06/20/21      SLP SHORT TERM GOAL #4   Title Pt will make simple requests (restaurant orders, grocery requests, bathroom) using AAC device with min-mod assistance from communication partner, 90% of opportunities.    Time 6    Period Weeks    Status Achieved    Target Date 09/18/21      SLP SHORT TERM GOAL #5   Title Pt will demonstrate basic navigation of AAC device (power, folder access, scrolling) with min assist from communication partner, 80% of opportunities.    Baseline 08/28/21: occasional min-mod cues (min for folder access, mod for power and scrolling)    Time 6    Period Weeks    Status Partially Met    Target Date 09/18/21              SLP Long Term Goals - 08/29/21 2229       SLP LONG TERM GOAL #1   Title Pt will use external aid for functional recall of completed/upcoming activities 80% accuracy with rare min A.    Time 12    Period Weeks    Status Achieved      SLP LONG TERM GOAL #2   Title Patient will engage in simple conversation for 3-5 minutes about a topic of interest, with supported conversation or use of visual aids/AAC if needed.    Time 12    Period Weeks    Status Revised      SLP LONG TERM GOAL #3   Title Patient will demonstrate knowledge of appropriate activities to support language function outside of ST with assistance from family.    Time 12    Period Weeks    Status Achieved      SLP LONG TERM GOAL #4   Title Pt will initiate communication exchange using AAC device outside of ST room over 3 session per caregiver report.    Time 12    Period Weeks    Status On-going      SLP LONG TERM GOAL #5   Title Pt will navigate through 2+ folders to access common needs/requests with min cues from communication partner in 80% of opportunities.    Time 12    Period Weeks    Status On-going              Plan - 07/04/21 1810      Clinical Impression Statement Patient presents with moderate-severe cognitive communication and moderate language impairments; neuropsychological evaluation describes symptoms of non-fluent PPA as well as receptive deficits. Patient received permanent SGD and continues to engage with device with caregiver support. Continue to provide education on how communication partner can use device to support Pt's communication and adapt with further declines in language ability. Patient has been noted to have difficulty initiating requests to use the restroom, and forgetting basic privacy (leaving door open to public restroom, exiting restroom without  refastening clothing). Timed toileting opportunities and verbal reminders (close door, hygiene) have been effective during previous 2 visits. Continue skilled ST with focus on functional communication and caregiver training to maximize communication abilities and support independence/quality of life.    Speech Therapy Frequency 1x /week    Duration 12 weeks    Treatment/Interventions Language facilitation;Environmental controls;SLP instruction and feedback;Compensatory techniques;Functional tasks;Compensatory strategies;Internal/external aids;Multimodal communcation approach;Patient/family education    Potential to Achieve Goals Good   -fair   Potential Considerations Ability to learn/carryover information;Previous level of function;Severity of impairments             Deneise Lever, MS, Actor [phone]

## 2021-10-29 ENCOUNTER — Encounter: Payer: Medicare HMO | Admitting: Speech Pathology

## 2021-10-31 ENCOUNTER — Encounter: Payer: Self-pay | Admitting: Physician Assistant

## 2021-11-05 ENCOUNTER — Encounter: Payer: Medicare HMO | Admitting: Speech Pathology

## 2021-11-06 ENCOUNTER — Ambulatory Visit: Payer: Medicare HMO | Admitting: Speech Pathology

## 2021-11-06 DIAGNOSIS — R41841 Cognitive communication deficit: Secondary | ICD-10-CM

## 2021-11-06 DIAGNOSIS — R4701 Aphasia: Secondary | ICD-10-CM

## 2021-11-07 NOTE — Therapy (Signed)
OUTPATIENT SPEECH LANGUAGE PATHOLOGY TREATMENT AND RECERTIFICATION  Patient Name: Chad Stone MRN: [id number] DOB:September 19, 1970, 51 y.o., male Today's Date: 11/07/2021    PCP: Fulton Reek, MD REFERRING PROVIDER: Sharene Butters, PA-C   End of Session - 11/07/21 0843     Visit Number 24    Number of Visits 30    Date for SLP Re-Evaluation 02/05/22    Authorization Type [employer]    Authorization - Visit Number 4    Progress Note Due on Visit 10    SLP Start Time 1608    SLP Stop Time  1700    SLP Time Calculation (min) 52 min    Activity Tolerance Patient tolerated treatment well               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   Febrile seizure    1 months old   Frontotemporal dementia without behavioral disturbance 06/07/2021   PPA presentation, 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 but also ongoing semantic impairment   Hyperlipidemia, mixed 06/05/2015   Nephrolithiasis    OSA (obstructive sleep apnea)    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 year old RH male with 3 year history of memory loss, initially followed by neurology at [facility] clinic for FTD, referred for cognitive-linguistic evaluation by Sharene Butters, PA-C. Pt had neuropsychological evaluation on 06/07/2021 findings of likely frontotemporal  dementia (FTD), with noted symptoms of language impairment (PPA), with both expressive and receptive language deficits. PMHx also includes hypertension, hyperlipidemia, glaucoma, depression, 1 cm L frontal meningioma, migraine headaches, OSA on CPAP.    THERAPY DIAG:  Aphasia  Cognitive communication deficit  Rationale for Evaluation and Treatment Rehabilitation  SUBJECTIVE: "Talk to my neighbor"  Pt accompanied by: family member Mother   PAIN:  Are you having pain? No   OBJECTIVE:   TODAY'S TREATMENT: Patient used communication device to check in at front desk, however volume was low. Patient expressed interest in using his communication device to talk with his new neighbor, a Marine scientist. SLP worked with pt to generate icons to explain his use of AAC device, as well as to ask questions and introduce her to other neighbors. Pt able to verbalize simple questions and sentences with written cues, rehearsal, which were programmed on [employer] device in pt's own voice. Role played interaction with neighbor; patient navigated to folder with extended time, required occasional min cues to select relevant icons.  PATIENT EDUCATION: Education details: using device for communication; opportunities Person educated: Patient and mother Education method: Explanation, Demonstration, and Verbal cues Education comprehension: verbalized understanding, verbal cues required, and needs further education   SLP Short Term Goals - 11/07/21 0845       SLP SHORT TERM GOAL #1   Title Pt will establish external aid for functional recall and bring to >75% of therapy sessions.    Time 6  Period Weeks    Status Achieved    Target Date 06/20/21      SLP SHORT TERM GOAL #2   Title Patient will generate sentence responses to simple questions >90% accuracy with mod cues.    Baseline 06/19/21: 75% with moderate cues    Time 6    Period Weeks    Status Partially Met    Target Date 06/20/21      SLP SHORT TERM  GOAL #3   Title Pt will engage in simple conversation 3-5 minutes with supported conversation strategies or visual aid if necessary.    Time 6    Period Weeks    Status Achieved    Target Date 06/20/21      SLP SHORT TERM GOAL #4   Title Pt will make simple requests (restaurant orders, grocery requests, bathroom) using AAC device with min-mod assistance from communication partner, 90% of opportunities.    Time 6    Period Weeks    Status Achieved    Target Date 09/18/21      SLP SHORT TERM GOAL #5   Title Pt will demonstrate basic navigation of AAC device (power, folder access, scrolling) with min assist from communication partner, 80% of opportunities.    Baseline 08/28/21: occasional min-mod cues (min for folder access, mod for power and scrolling)    Time 6    Period Weeks    Status Partially Met    Target Date 09/18/21              SLP Long Term Goals - 11/07/21 0845       SLP LONG TERM GOAL #1   Title Pt will use external aid for functional recall of completed/upcoming activities 80% accuracy with rare min A.    Time 12    Period Weeks    Status Achieved      SLP LONG TERM GOAL #2   Title Patient will engage in simple conversation for 3-5 minutes about a topic of interest, with supported conversation or use of visual aids/AAC if needed.    Time 12    Period Weeks    Status Achieved      SLP LONG TERM GOAL #3   Title Patient will demonstrate knowledge of appropriate activities to support language function outside of ST with assistance from family.    Time 12    Period Weeks    Status Achieved      SLP LONG TERM GOAL #4   Title Pt will initiate communication exchange using AAC device outside of ST room over 3 session per caregiver report.    Time 12    Period Weeks    Status On-going   renewed 11/07/21   Target Date 02/05/22      SLP LONG TERM GOAL #5   Title Pt will navigate through 2+ folders to access common needs/requests with min cues from communication  partner in 80% of opportunities.    Time 12    Period Weeks    Status Achieved              Plan - 07/04/21 1810     Clinical Impression Statement Patient presents with moderate-severe cognitive communication and moderate language impairments; neuropsychological evaluation describes symptoms of non-fluent PPA as well as receptive deficits. Patient received permanent SGD and continues to engage with device with caregiver support. Continue to provide education on how communication partner can use device to support Pt's communication and adapt with further declines in language  ability. Frequency has been reduced to 1x every other week, with continued focus on carryover and using his device in real world communication. Anticipate d/c in next 4-6 sessions. Recertification completed today for remaining visits; continue with LTGs as stated above. Continue skilled ST with focus on functional communication and caregiver training to maximize communication abilities and support independence/quality of life.    Speech Therapy Frequency 1x / every other week    Duration 12 weeks    Treatment/Interventions Language facilitation;Environmental controls;SLP instruction and feedback;Compensatory techniques;Functional tasks;Compensatory strategies;Internal/external aids;Multimodal communcation approach;Patient/family education    Potential to Achieve Goals Good   -fair   Potential Considerations Ability to learn/carryover information;Previous level of function;Severity of impairments             Deneise Lever, MS, Actor [phone]

## 2021-11-12 ENCOUNTER — Encounter: Payer: Medicare HMO | Admitting: Speech Pathology

## 2021-11-14 ENCOUNTER — Encounter: Payer: Self-pay | Admitting: Internal Medicine

## 2021-11-14 ENCOUNTER — Encounter: Payer: Self-pay | Admitting: Physician Assistant

## 2021-11-20 ENCOUNTER — Ambulatory Visit: Payer: Medicare HMO | Attending: Physician Assistant | Admitting: Speech Pathology

## 2021-11-20 ENCOUNTER — Other Ambulatory Visit: Payer: Self-pay | Admitting: Internal Medicine

## 2021-11-20 DIAGNOSIS — R0602 Shortness of breath: Secondary | ICD-10-CM

## 2021-11-20 DIAGNOSIS — R4701 Aphasia: Secondary | ICD-10-CM | POA: Diagnosis present

## 2021-11-20 DIAGNOSIS — R41841 Cognitive communication deficit: Secondary | ICD-10-CM | POA: Insufficient documentation

## 2021-11-21 NOTE — Therapy (Signed)
OUTPATIENT SPEECH LANGUAGE PATHOLOGY TREATMENT NOTE  Patient Name: Chad Stone MRN: [id number] DOB:Jun 01, 1970, 51 y.o., male Today's Date: 11/21/2021    PCP: Fulton Reek, MD REFERRING PROVIDER: Sharene Butters, PA-C   End of Session - 11/20/21 1602     Visit Number 25    Number of Visits 30    Date for SLP Re-Evaluation 02/05/22    Authorization Type [employer] Medicare    Authorization - Visit Number 5    Progress Note Due on Visit 10    SLP Start Time 1602    SLP Stop Time  1700    SLP Time Calculation (min) 58 min    Activity Tolerance Patient tolerated treatment well               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   Febrile seizure    38 months old   Frontotemporal dementia without behavioral disturbance 06/07/2021   PPA presentation, 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 but also ongoing semantic impairment   Hyperlipidemia, mixed 06/05/2015   Nephrolithiasis    OSA (obstructive sleep apnea)    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 year old RH male with 3 year history of memory loss, initially followed by neurology at [facility] clinic for FTD, referred for cognitive-linguistic evaluation by Sharene Butters, PA-C. Pt had neuropsychological evaluation on 06/07/2021 findings of likely frontotemporal dementia (FTD),  with noted symptoms of language impairment (PPA), with both expressive and receptive language deficits. PMHx also includes hypertension, hyperlipidemia, glaucoma, depression, 1 cm L frontal meningioma, migraine headaches, OSA on CPAP.    THERAPY DIAG:  Cognitive communication deficit  Aphasia  Rationale for Evaluation and Treatment Rehabilitation  SUBJECTIVE: Arrives with [employer]  Pt accompanied by: family member Mother   PAIN:  Are you having pain? No   OBJECTIVE:   TODAY'S TREATMENT: Patient reports he did not use communication device to talk with neighbor due to not having the opportunity. He did take his device to church and was able to access appropriate icons with moderately extended time. Patient and mother deny new communication needs. Demonstrated use of therapy activities on his device for language stimulation daily; patient able to complete simple language tasks with moderate cues.  PATIENT EDUCATION: Education details: using device for communication; opportunities Person educated: Patient and mother Education method: Explanation, Demonstration, and Verbal cues Education comprehension: verbalized understanding, verbal cues required, and needs further education   SLP Short Term Goals - 11/07/21 0845       SLP SHORT TERM GOAL #1   Title Pt will establish external aid for functional recall and bring to >75% of therapy sessions.    Time 6    Period Weeks    Status Achieved    Target Date 06/20/21      SLP SHORT TERM GOAL #2   Title Patient will generate sentence responses to simple  questions >90% accuracy with mod cues.    Baseline 06/19/21: 75% with moderate cues    Time 6    Period Weeks    Status Partially Met    Target Date 06/20/21      SLP SHORT TERM GOAL #3   Title Pt will engage in simple conversation 3-5 minutes with supported conversation strategies or visual aid if necessary.    Time 6    Period Weeks    Status Achieved    Target Date 06/20/21       SLP SHORT TERM GOAL #4   Title Pt will make simple requests (restaurant orders, grocery requests, bathroom) using AAC device with min-mod assistance from communication partner, 90% of opportunities.    Time 6    Period Weeks    Status Achieved    Target Date 09/18/21      SLP SHORT TERM GOAL #5   Title Pt will demonstrate basic navigation of AAC device (power, folder access, scrolling) with min assist from communication partner, 80% of opportunities.    Baseline 08/28/21: occasional min-mod cues (min for folder access, mod for power and scrolling)    Time 6    Period Weeks    Status Partially Met    Target Date 09/18/21              SLP Long Term Goals - 11/07/21 0845       SLP LONG TERM GOAL #1   Title Pt will use external aid for functional recall of completed/upcoming activities 80% accuracy with rare min A.    Time 12    Period Weeks    Status Achieved      SLP LONG TERM GOAL #2   Title Patient will engage in simple conversation for 3-5 minutes about a topic of interest, with supported conversation or use of visual aids/AAC if needed.    Time 12    Period Weeks    Status Achieved      SLP LONG TERM GOAL #3   Title Patient will demonstrate knowledge of appropriate activities to support language function outside of ST with assistance from family.    Time 12    Period Weeks    Status Achieved      SLP LONG TERM GOAL #4   Title Pt will initiate communication exchange using AAC device outside of ST room over 3 session per caregiver report.    Time 12    Period Weeks    Status On-going   renewed 11/07/21   Target Date 02/05/22      SLP LONG TERM GOAL #5   Title Pt will navigate through 2+ folders to access common needs/requests with min cues from communication partner in 80% of opportunities.    Time 12    Period Weeks    Status Achieved              Plan - 07/04/21 1810     Clinical Impression Statement Patient presents with moderate-severe cognitive  communication and moderate language impairments; neuropsychological evaluation describes symptoms of non-fluent PPA as well as receptive deficits. Patient received permanent SGD and continues to engage with device with caregiver support. Continue to provide education on how communication partner can use device to support Pt's communication and adapt with further declines in language ability.  Patient and mother in agreement with decreasing frequency; plan to continue 1x per month for 2 months with focus on caregiver training to support pt's communication needs. Anticipate d/c in next  2 sessions. Continue skilled ST with focus on functional communication and caregiver training to maximize communication abilities and support independence/quality of life.    Speech Therapy Frequency 1x / every other week    Duration 12 weeks    Treatment/Interventions Language facilitation;Environmental controls;SLP instruction and feedback;Compensatory techniques;Functional tasks;Compensatory strategies;Internal/external aids;Multimodal communcation approach;Patient/family education    Potential to Achieve Goals Good   -fair   Potential Considerations Ability to learn/carryover information;Previous level of function;Severity of impairments             Deneise Lever, MS, Actor [phone]

## 2021-11-23 ENCOUNTER — Telehealth: Payer: Self-pay | Admitting: Internal Medicine

## 2021-11-23 DIAGNOSIS — R0602 Shortness of breath: Secondary | ICD-10-CM

## 2021-11-23 MED ORDER — ALBUTEROL SULFATE HFA 108 (90 BASE) MCG/ACT IN AERS: INHALATION_SPRAY | RESPIRATORY_TRACT | 0 refills | Status: DC

## 2021-11-23 NOTE — Telephone Encounter (Signed)
Spoke to [place] with [employer]. She stated that Dr. Clovis Fredrickson [place] license is showing inactive.  Albuterol sent under Dr. Jayme Cloud. Received approval verbally from Dr. Jayme Cloud.   Dr. Belia Heman, please advise.

## 2021-11-26 NOTE — Telephone Encounter (Signed)
Received email from Dr. Belia Heman. He verified with Budd Palmer and license number is active. Spoke to [employer] with [employer] and relayed above message. She stated that she will contact corporate and have them correct this issue. Nothing further needed at this time.

## 2021-11-27 ENCOUNTER — Encounter: Payer: Medicare HMO | Admitting: Speech Pathology

## 2021-12-04 ENCOUNTER — Encounter: Payer: Medicare HMO | Admitting: Speech Pathology

## 2021-12-11 ENCOUNTER — Encounter: Payer: Medicare HMO | Admitting: Speech Pathology

## 2021-12-14 ENCOUNTER — Encounter: Payer: Self-pay | Admitting: Internal Medicine

## 2021-12-14 ENCOUNTER — Other Ambulatory Visit
Admission: RE | Admit: 2021-12-14 | Discharge: 2021-12-14 | Disposition: A | Discharge status: Home / Self Care | Payer: Medicare HMO | Attending: Internal Medicine | Admitting: Internal Medicine

## 2021-12-14 ENCOUNTER — Ambulatory Visit (INDEPENDENT_AMBULATORY_CARE_PROVIDER_SITE_OTHER): Payer: Medicare HMO | Admitting: Internal Medicine

## 2021-12-14 VITALS — BP 130/72 | HR 77 | Temp 97.8°F | Ht 72.0 in | Wt 219.6 lb

## 2021-12-14 DIAGNOSIS — J209 Acute bronchitis, unspecified: Secondary | ICD-10-CM | POA: Insufficient documentation

## 2021-12-14 DIAGNOSIS — Z1152 Encounter for screening for COVID-19: Secondary | ICD-10-CM | POA: Diagnosis not present

## 2021-12-14 DIAGNOSIS — R079 Chest pain, unspecified: Secondary | ICD-10-CM | POA: Insufficient documentation

## 2021-12-14 DIAGNOSIS — Z1383 Encounter for screening for respiratory disorder NEC: Secondary | ICD-10-CM | POA: Diagnosis not present

## 2021-12-14 DIAGNOSIS — B348 Other viral infections of unspecified site: Secondary | ICD-10-CM | POA: Diagnosis not present

## 2021-12-14 LAB — RESPIRATORY PANEL BY PCR

## 2021-12-14 MED ORDER — AZITHROMYCIN 250 MG PO TABS: ORAL_TABLET | ORAL | 0 refills | Status: DC

## 2021-12-14 NOTE — Patient Instructions (Signed)
Check respiratory viral panel Start Z-Pak

## 2021-12-14 NOTE — Progress Notes (Addendum)
[facility] [place] [facility] Consultation      Pulmonary function testing October 2022 Patient had a difficult time understanding and following test directions. Patient gave his best effort. Pt unable to blow out to meet criteria on FVC testing,  Spirometry Data Is Acceptable and Reproducible  No obvious evidence of Obstructive Airways disease or Restrictive Lung disease        CHIEF COMPLAINT:   Follow-up shortness of breath Previous history and diagnosis of COVID-19 infection July 2022     HISTORY OF PRESENT ILLNESS   No exacerbation at this time No evidence of heart failure at this time No evidence or signs of infection at this time No respiratory distress No fevers, chills, nausea, vomiting, diarrhea No evidence of lower extremity edema No evidence hemoptysis  Non-smoker Secondhand smoke exposure   In July 2022 Patient was prescribed prednisone and antibiotics for his COVID-19 infection   Patient seems to have sinus congestion and postnasal drainage Feels like patient is getting sick   PAST MEDICAL HISTORY   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   Febrile seizure    41 months old   Frontotemporal dementia without behavioral disturbance 06/07/2021   PPA presentation, 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 but also ongoing semantic impairment   Hyperlipidemia, mixed 06/05/2015   Nephrolithiasis    OSA (obstructive sleep apnea)    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   Home Medication:  Current Outpatient Rx   Order #: [id number] Class: Normal   Order #: [id number] Class: Historical Med   Order #: [id number] Class: Historical Med   Order #: [id number] Class: Historical Med   Order #: [id number] Class: Historical Med   Order #: [id number] Class: Historical Med   Order #: [id number] Class: Historical Med   Order #: [id number] Class: Historical Med   Order #: [id number] Class:  Historical Med   Order #: [id number] Class: Historical Med   Order #: [id number] Class: Historical Med   Order #: [id number] Class: Historical Med   Order #: [id number] Class: Historical Med   Order #: [id number] Class: Historical Med   Order #: [id number] Class: Historical Med   Order #: [id number] Class: Normal   Order #: [id number] Class: Historical Med   Order #: [id number] Class: Historical Med    Current Medication:  Current Outpatient Medications:    albuterol (VENTOLIN HFA) 108 (90 Base) MCG/ACT inhaler, INHALE 1 TO 2 PUFFS BY MOUTH EVERY 6 HOURS AS NEEDED FOR COUGH, Disp: 18 g, Rfl: 0   amLODipine (NORVASC) 5 MG tablet, Take 5 mg by mouth daily., Disp: , Rfl:    amoxicillin-clavulanate (AUGMENTIN) 875-125 MG tablet, Take 1 tablet by mouth 2 (two) times daily., Disp: , Rfl:    amphetamine-dextroamphetamine (ADDERALL) 10 MG tablet, Take 10 mg by mouth daily with breakfast. , Disp: , Rfl:    bisoprolol (ZEBETA) 5 MG tablet, Take 1 tablet by mouth daily., Disp: , Rfl:    busPIRone (BUSPAR) 10 MG tablet, Take 1 tablet by mouth 2 (two) times daily., Disp: , Rfl:    diclofenac Sodium (VOLTAREN) 1 % GEL, Apply topically., Disp: , Rfl:    donepezil (ARICEPT) 10 MG tablet, Take 1 tablet by mouth at bedtime., Disp: , Rfl:    escitalopram (LEXAPRO) 10 MG tablet, Take 10 mg by mouth daily., Disp: , Rfl:    etodolac (LODINE) 500 MG tablet, Take 500 mg by mouth 2 (two) times daily., Disp: , Rfl:    latanoprost (XALATAN) 0.005 % ophthalmic solution, Place 1 drop into  both eyes at bedtime. , Disp: , Rfl:    Naproxen Sodium (ALEVE PO), Take 500 mg elemental calcium/kg/hr by mouth daily as needed., Disp: , Rfl:    olmesartan (BENICAR) 40 MG tablet, Take by mouth., Disp: , Rfl:    olmesartan-hydrochlorothiazide (BENICAR HCT) 20-12.5 MG tablet, Take 1 tablet by mouth daily., Disp: , Rfl:    predniSONE (STERAPRED UNI-PAK 48 TAB) 10 MG (48) TBPK tablet, See admin instructions. see package, Disp: , Rfl:    rosuvastatin  (CRESTOR) 10 MG tablet, Take 1 tablet (10 mg total) by mouth daily., Disp: 90 tablet, Rfl: 3   SUMAtriptan (IMITREX) 100 MG tablet, Take by mouth., Disp: , Rfl:    venlafaxine XR (EFFEXOR-XR) 75 MG 24 hr capsule, Take 75 mg by mouth daily with breakfast. , Disp: , Rfl:     ALLERGIES   Patient has no known allergies.       Review of Systems: Gen:  Denies  fever, sweats, chills weight loss  HEENT: Denies blurred vision, double vision, ear pain, eye pain, hearing loss, nose bleeds, sore throat + sinus congestion Cardiac:  No dizziness, chest pain or heaviness, chest tightness,edema, No JVD Resp:   No cough, -sputum production, -shortness of breath,-wheezing, -hemoptysis,  Other:  All other systems negative    Physical Examination:   General Appearance: No distress  EYES PERRLA, EOM intact.   NECK Supple, No JVD Pulmonary: normal breath sounds, No wheezing.  CardiovascularNormal S1,S2.  No m/r/g.   Abdomen: Benign, Soft, non-tender. ALL OTHER ROS ARE NEGATIVE      ASSESSMENT/PLAN   51 year old pleasant white male seen today for assessment post COVID infection back in July 2022 with a persistent shortness of breath and dyspnea exertion with intermittent cough and wheezing      Patient occasionally uses albuterol as needed  He has a history of secondhand smoke exposure    Patient seems to have sinus congestion and postnasal drainage Feels like patient is getting sick   At this time patient may be coming down with a viral infection or sinus infection Will check respiratory viral panel and prescribe azithromycin     MEDICATION ADJUSTMENTS/LABS AND TESTS ORDERED: Albuterol as needed Avoid second hand smoke Avoid sick contacts Check respiratory viral panel Start azithromycin   CURRENT MEDICATIONS REVIEWED AT LENGTH WITH PATIENT TODAY   Patient  satisfied with Plan of action and management. All questions answered  FOLLOW UP IN 1 YEAR AS NEEDED  Total time  spent 21 mins    Budd Freiermuth Santiago Glad, M.D.  [facility]  Medical Director [facility] Medical Director [facility] Cardio-Pulmonary Department

## 2021-12-20 ENCOUNTER — Telehealth: Payer: Self-pay | Admitting: Internal Medicine

## 2021-12-20 NOTE — Telephone Encounter (Signed)
Chad Fulling, MD  Rosaland Lao, CMA Patient has RHINOVIRUS which causes the common cold(viral infection)         Patient's sister, Michelle(DPR) is aware of results and voiced her understanding.  Nothing further needed.

## 2022-01-01 ENCOUNTER — Ambulatory Visit: Payer: Medicare HMO | Attending: Physician Assistant | Admitting: Speech Pathology

## 2022-01-01 DIAGNOSIS — R41841 Cognitive communication deficit: Secondary | ICD-10-CM

## 2022-01-01 DIAGNOSIS — R4701 Aphasia: Secondary | ICD-10-CM

## 2022-01-01 NOTE — Patient Instructions (Signed)
Speech Therapy Discharge Recommendations  Try to stick to a regular routine.  Use your calendar each day. Loraine Leriche off the days and your activities. Do something to keep your mind stimulated and active every day. Some suggestions: Call someone on the phone and have a conversation for 10-15 minutes.  Spend time outside and caring for your goats Coloring sheets or word search If you want to try to add a new habit in, it will help to add it into an existing routine. If you are having trouble getting Arlys John to do an activity: Pick a regular time (like after dinner ) and sit down at the table to do something together (a word search, puzzle, play a game, or look at [employer] book or old photo albums together)   Tips for communicating with Arlys John How to help: Speak in simple sentences, BUT don't talk down to the person or talk too loudly.  Allow the person extra time to process what you've said. Try waiting 90 seconds before repeating yourself.  Provide information in short chunks. If you're giving directions break them down into small steps.  Write down key instructions and information, or encourage the person to write it down themselves. Use calendars, maps, and other visuals to help.  Verify important information with the source to be sure information is reliable. People with cognitive impairments may not be accurate communicators, even if their speech sounds good.

## 2022-01-01 NOTE — Therapy (Signed)
OUTPATIENT SPEECH LANGUAGE PATHOLOGY TREATMENT NOTE AND DISCHARGE SUMMARY  Patient Name: Chad Stone MRN: [id number] DOB:[birth year], 51 y.o., male Today's Date: 01/01/2022  SPEECH THERAPY DISCHARGE SUMMARY  Visits from Start of Care: 26  Current functional level related to goals / functional outcomes: Pt was seen for 26 speech therapy sessions targeting cognitive and communication deficits secondary to dementia. Pt is able to use calendar and memory book with family support. Pt has [employer] TouchTalk communication device customized to facilitate communication with church, family, and friends, access emergency services, and make common requests; it is anticipated that he will require ongoing cues and support from family to use his device (mother and sister have been present for sessions and trained on the device).   Remaining deficits: Moderate-severe cognitive communication impairments and moderate language impairment which is expected to deteriorate with progression of disease.   Education / Equipment: Otilio Miu AAC device   Patient agrees to discharge. Patient goals were partially met. Patient is being discharged due to being pleased with the current functional level.Marland Kitchen     PCP: Fulton Reek, MD REFERRING PROVIDER: Sharene Butters, PA-C   End of Session - 01/01/22 1700     Visit Number 26    Number of Visits 30    Date for SLP Re-Evaluation 02/05/22    Authorization Type [employer]    Authorization - Visit Number 6    Progress Note Due on Visit 10    SLP Start Time 1535    SLP Stop Time  1635    SLP Time Calculation (min) 60 min    Activity Tolerance Patient tolerated treatment well               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   Febrile seizure    56 months old   Frontotemporal dementia without behavioral disturbance 06/07/2021   PPA  presentation, 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 but also ongoing semantic impairment   Hyperlipidemia, mixed 06/05/2015   Nephrolithiasis    OSA (obstructive sleep apnea)    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 year old RH male with 3 year history of memory loss, initially followed by neurology at [facility] for FTD, referred for cognitive-linguistic evaluation by Sharene Butters, PA-C. Pt had neuropsychological evaluation on 06/07/2021 findings of likely frontotemporal dementia (FTD), with noted symptoms of language impairment (PPA), with both expressive and receptive language deficits. PMHx also includes hypertension, hyperlipidemia, glaucoma, depression, 1 cm L frontal meningioma, migraine headaches, OSA on CPAP.    THERAPY DIAG:  Cognitive communication deficit  Aphasia  Rationale for Evaluation and Treatment Rehabilitation  SUBJECTIVE: Pt does not initiate communication today, but gives Y/N and mostly one word responses when prompted.  Pt accompanied by: family member Mother   PAIN:  Are you having pain? No   OBJECTIVE:   TODAY'S TREATMENT: Pt, mother report communication  stable since previous visit. Mother reports Chad Stone does not use his device without prompting. Education provided that due to pt's difficulties with initiation he will need structure and support. Encouraged use of device in routine settings such as  making requests for groceries and needs. Mother reports pt frustration during communication with his father due to communication style. Provided education on communication strategies and handout to share with family, to improve communication with Chad Stone, such as slowing down, chunking information, revealing competence, and using neutral or positive tone. Information provided on community resources (see below for details). Continue to reinforce use of routines, and worked with mother to identify activities and strategies to engage pt at home (see pt instructions for details).  PATIENT EDUCATION: Education details: using device for communication; community resources including home care, vocational rehabilitation, medical alert systems Person educated: Patient and mother Education method: Explanation, Demonstration, and Verbal cues Education comprehension: verbalized understanding, verbal cues required, and needs further education   SLP Short Term Goals - 01/01/22 1701       SLP SHORT TERM GOAL #1   Title Pt will establish external aid for functional recall and bring to >75% of therapy sessions.    Time 6    Period Weeks    Status Achieved    Target Date 06/20/21      SLP SHORT TERM GOAL #2   Title Patient will generate sentence responses to simple questions >90% accuracy with mod cues.    Baseline 06/19/21: 75% with moderate cues    Time 6    Period Weeks    Status Partially Met    Target Date 06/20/21      SLP SHORT TERM GOAL #3   Title Pt will engage in simple conversation 3-5 minutes with supported conversation strategies or visual aid if necessary.    Time 6    Period Weeks    Status Achieved    Target Date 06/20/21      SLP SHORT TERM GOAL #4   Title Pt will make simple requests (restaurant orders, grocery requests, bathroom) using AAC device with min-mod assistance from communication partner, 90% of opportunities.    Time 6    Period Weeks    Status Achieved    Target Date  09/18/21      SLP SHORT TERM GOAL #5   Title Pt will demonstrate basic navigation of AAC device (power, folder access, scrolling) with min assist from communication partner, 80% of opportunities.    Baseline 08/28/21: occasional min-mod cues (min for folder access, mod for power and scrolling)    Time 6    Period Weeks    Status Partially Met    Target Date 09/18/21              SLP Long Term Goals - 01/01/22 1701       SLP LONG TERM GOAL #1   Title Pt will use external aid for functional recall of completed/upcoming activities 80% accuracy with rare min A.    Time 12    Period Weeks    Status Achieved      SLP LONG TERM GOAL #2   Title Patient will engage in simple conversation for 3-5 minutes about a topic of interest, with supported conversation or use of visual aids/AAC if needed.    Time 12    Period Weeks    Status Achieved      SLP LONG TERM GOAL #3   Title Patient will demonstrate knowledge of appropriate activities to  support language function outside of ST with assistance from family.    Time 12    Period Weeks    Status Achieved      SLP LONG TERM GOAL #4   Title Pt will initiate communication exchange using AAC device outside of ST room over 3 session per caregiver report.    Time 12    Period Weeks    Status Not Met   renewed 11/07/21     SLP LONG TERM GOAL #5   Title Pt will navigate through 2+ folders to access common needs/requests with min cues from communication partner in 80% of opportunities.    Time 12    Period Weeks    Status Achieved              Plan - 07/04/21 1810     Clinical Impression Statement Patient presents with moderate-severe cognitive communication and moderate language impairments; neuropsychological evaluation describes symptoms of non-fluent PPA as well as receptive deficits. Patient received permanent SGD and continues to engage with device with caregiver support. Continue to provide education on how communication  partner can use device to support Pt's communication and adapt with further declines in language ability. Patient and mother are in agreement with d/c today and demonstrate understanding of resources provided to assist Chad Stone with his communication and support his participation in daily activities.    Speech Therapy Frequency D/c   Duration D/c   Treatment/Interventions Language facilitation;Environmental controls;SLP instruction and feedback;Compensatory techniques;Functional tasks;Compensatory strategies;Internal/external aids;Multimodal communcation approach;Patient/family education    Potential to Achieve Goals Good   -fair   Potential Considerations Ability to learn/carryover information;Previous level of function;Severity of impairments             Deneise Lever, MS, Actor [phone]

## 2022-01-25 ENCOUNTER — Ambulatory Visit (INDEPENDENT_AMBULATORY_CARE_PROVIDER_SITE_OTHER): Payer: Medicare HMO | Admitting: Physician Assistant

## 2022-01-25 ENCOUNTER — Encounter: Payer: Self-pay | Admitting: Physician Assistant

## 2022-01-25 VITALS — BP 111/73 | HR 80 | Ht 72.0 in | Wt 222.6 lb

## 2022-01-25 DIAGNOSIS — F0280 Dementia in other diseases classified elsewhere without behavioral disturbance: Secondary | ICD-10-CM | POA: Diagnosis not present

## 2022-01-25 DIAGNOSIS — G3109 Other frontotemporal dementia: Secondary | ICD-10-CM | POA: Diagnosis not present

## 2022-01-25 DIAGNOSIS — G3101 Pick's disease: Secondary | ICD-10-CM

## 2022-01-25 MED ORDER — DONEPEZIL HCL 5 MG PO TABS: 10.0000 mg | ORAL_TABLET | Freq: Every day | ORAL | 11 refills | Status: DC

## 2022-01-25 NOTE — Progress Notes (Signed)
Assessment/Plan:    Frontotemporal dementia (FTD)  Primary progressive aphasia (PPA), logopenic type   Chad Stone is a very pleasant 53 y.o. RH male  with  a history of hypertension, hyperlipidemia, glaucoma, depression, 1 cm L frontal meningioma, migraine headaches,  OSA on CPAP, seen today for evaluation of memory loss. He had initially been seen at [facility] in [place], [place],  Dr. Tanna Furry (11/24/2020) with findings suspicious for FTD, MRI showing R temporal, R frontal, Left Frontal atrophy.  MoCA at the time was 10/30.  He was seen by neuropsychology, yielding a diagnosis of likely FTD with PPA logopenic type, seen today in follow up for memory loss. Patient is currently on donepezil 10 mg daily, tolerating well.  PET scan has been unable to be performed due to insurance denial.  LP 07/25/2021 remarkable for 14 3 3  at 5300, T Tau 211, Ptau (181P) 15.6,  PTau/a beta 42 ratio 0.015 (not consistent with the presence of changes associated with Alzheimer's disease).  Since his last visit, the patient has had speech therapy, which has been helpful.  He uses a pad to communicate with his neighbors.  Today's MMSE is 17/30.  He is stable from the cognitive standpoint. HE is able to participate in ADLs and continues to live alone. Mood is stable.     Follow up in 6 months. Continue donepezil 10 mg daily. Side effects were discussed  Continue speech therapy (he has finished the sessions but he continues to do so at home).  He also likes to listen to music, as a form of speech therapy. Recommend good control of cardiovascular risk factors.   Continue to control mood as per PCP     Subjective:    This patient is accompanied in the office by his mother who supplements the history.  Previous records as well as any outside records available were reviewed prior to todays visit. Patient was last seen on 07/20/2021.  Last MoCA on 05/01/2021 was 10/30.    Any changes  in memory since last visit? Mother states that he has some good and not so good days." Short term memory is the issue", but most of the time "is about the same".  He uses his pad to communicate as learned through  ST. "He sings old songs and can sing every word, especially Country".  repeats oneself?  Denies  Disoriented when walking into a room?  Patient denies  Leaving objects in unusual places?  Only one time he left the keys inside the jeans in the laundry basket and could not find it.   Wandering behavior?  denies   Any personality changes since last visit?  denies   Any worsening depression?:  denies   Hallucinations or paranoia?  denies   Seizures?    denies    Any sleep changes?  " Sleeps ok" Denies vivid dreams, REM behavior or sleepwalking   Sleep apnea?  He has  CPAP now.  Any hygiene concerns?    denies   Independent of bathing and dressing?  Endorsed  Does the patient needs help with medications? He is in charge   Who is in charge of the finances? In charge Any changes in appetite?  denies     Patient have trouble swallowing?  denies   Does the patient cook? No  Any kitchen accidents such as leaving the stove on? Patient denies   Any headaches?   denies   Chronic back pain  denies  Ambulates with difficulty?   Walking when the weather permits  Recent falls or head injuries? denies     Unilateral weakness, numbness or tingling?    denies   Any tremors?  denies   Any anosmia?  Patient denies   Any incontinence of urine? Frequent urination "I drink water all day long"  Any bowel dysfunction?     denies      Patient lives  by himself and able to perform ADLS Does the patient drive?  No longer drives  Uses the pad a lot, when he goes out and talks to neighbors .   Neurocognitive testing 06/07/21 Dr. Milbert Coulter  Major Neurocognitive Disorder ("dementia") at the present time.  The most likely etiology is the presence of frontotemporal lobar degeneration leading to a presentation of  frontotemporal dementia (FTD). This is most strongly evidenced by neuroimaging, which has suggested advanced and progressive atrophy surrounding the frontal and temporal lobes. FTD is best described as an umbrella term which encompasses several different presentations. These are commonly grouped into the behavioral variant and several language variants. Given that Mr. Mckamie and his mother denied any concerns surrounding impulsivity, behavioral disinhibition, or severe personality changes, his presentation would not align with the behavioral variant of FTD. 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 Mr. Bisig notably effortful speech, impaired fluency, and agrammatism found within writing samples would best align with the non-fluent primary progressive aphasia subtype. However, comprehension difficulties were also quite significant, suggesting that he also may display features of the semantic dementia subtype. Recent neuroimaging seems to align better with former subtype relative to the latter. However, it certainly remains plausible that features of both are co-occurring. Mr. Henton dementia presentation would appear to be fairly advanced given the extent of diffuse impairment. While it can be common to see language and executive functioning deficits in any FTD presentation, this will eventually spread to all areas of the brain as the disease progresses.      Initial visit 05/01/2021 the patient is seen in neurologic consultation at the request of [name], MD for the evaluation of memory.  The patient is accompanied by his mother who supplements the history. This is a 52 y.o. year old RH  male who has had memory issues for about 3 years.  However, about 1 year ago, the patient began having problems with his job performance, requiring to stop working because he could not follow instructions  unless a task were being shown to him first.  He also noted that he could not hold a tool properly for its use.  Eventually, he needed assistance to pay bills or to remember to take his medications, his sister is now in charge.  He now struggles remembering days, names of places and people, especially over the last year.  He is now having some difficulty with language, "having a hard time thinking of the word, taking longer than before ".  He denies repeating himself.  He denies living objects in unusual places, but he does lose things.  He is also showing repetitive behavior, like playing with objects.  He has increasing dependence.  He denies any concerns for seizures (although he had seizures at age 24 after a febrile illness without recurrence), myoclonus, tremor, or sensory loss.  No history of TBI. He denies any significant mood changes, although he is on antidepressants.  He denies any hallucinations, impulsivity, or  irritability.  His major symptoms have been increasing fatigue, daytime sleepiness, mild gait instability and slower pace, without weakness, rigidity or tremors.  No history of stroke. He is on physical therapy.  At night, he reports awakening more than 3 times per night, with nocturia, but he denies any REM behavior, or vivid dreams.  He has a history of right temporal headaches, denies double vision, dizziness, focal numbness or tingling, unilateral weakness.  Denies alcohol or tobacco.  He has a history of OSA, his mother states that he needs CPAP, but the patient denies.  No wandering behavior.  He no longer drives.  No hygiene concerns.Family History remarkable for grandmother with Alzheimer's disease     MRI brain with and without contrast 04/09/2021: 1. Findings concerning for frontotemporal degeneration with mild interval progression of volume loss since prior MRI, more noticeable in the right temporal lobe. 2. Inflammatory paranasal sinus disease. Progressing compared to prior MRI.      PREVIOUS MEDICATIONS:   CURRENT MEDICATIONS:  Outpatient Encounter Medications as of 01/25/2022  Medication Sig   albuterol (VENTOLIN HFA) 108 (90 Base) MCG/ACT inhaler INHALE 1 TO 2 PUFFS BY MOUTH EVERY 6 HOURS AS NEEDED FOR COUGH   amLODipine (NORVASC) 5 MG tablet Take 5 mg by mouth daily.   amphetamine-dextroamphetamine (ADDERALL) 10 MG tablet Take 10 mg by mouth daily with breakfast.    Ascorbic Acid (VITAMIN C) 500 MG CAPS Take by mouth.   aspirin EC 81 MG tablet Take 81 mg by mouth daily. Swallow whole.   bisoprolol (ZEBETA) 5 MG tablet Take 1 tablet by mouth daily.   guaiFENesin (MUCINEX) 600 MG 12 hr tablet Take 600 mg by mouth 2 (two) times daily.   latanoprost (XALATAN) 0.005 % ophthalmic solution Place 1 drop into both eyes at bedtime.    Magnesium 250 MG TABS Take 250 mg by mouth daily.   Multiple Vitamin (MULTIVITAMIN) tablet Take 1 tablet by mouth daily.   Naproxen Sodium (ALEVE PO) Take 500 mg elemental calcium/kg/hr by mouth daily as needed.   nortriptyline (PAMELOR) 25 MG capsule Take 25 mg by mouth at bedtime.   olmesartan (BENICAR) 40 MG tablet Take by mouth.   rosuvastatin (CRESTOR) 10 MG tablet Take 1 tablet (10 mg total) by mouth daily.   Turmeric 500 MG CAPS Take by mouth.   venlafaxine XR (EFFEXOR-XR) 75 MG 24 hr capsule Take 75 mg by mouth daily with breakfast.    vitamin B-12 (CYANOCOBALAMIN) 500 MCG tablet Take 500 mcg by mouth daily.   [DISCONTINUED] donepezil (ARICEPT) 10 MG tablet Take 1 tablet by mouth at bedtime.   donepezil (ARICEPT) 5 MG tablet Take 2 tablets (10 mg total) by mouth at bedtime.   [DISCONTINUED] azithromycin (ZITHROMAX Z-PAK) 250 MG tablet Take 2 tablets on Day 1 and then 1 tablet daily till gone.   No facility-administered encounter medications on file as of 01/25/2022.       01/25/2022   10:00 AM  MMSE - Mini Mental State Exam  Orientation to time 2  Orientation to Place 4  Registration 2  Attention/ Calculation 0  Recall 2   Language- name 2 objects 2  Language- repeat 1  Language- follow 3 step command 3  Language- read & follow direction 1  Write a sentence 0  Copy design 0  Total score 17      05/02/2021    6:00 AM  Montreal Cognitive Assessment   Visuospatial/ Executive (0/5) 1  Naming (0/3) 2  Attention:  Read list of digits (0/2) 0  Attention: Read list of letters (0/1) 1  Attention: Serial 7 subtraction starting at 100 (0/3) 0  Language: Repeat phrase (0/2) 2  Language : Fluency (0/1) 0  Abstraction (0/2) 1  Delayed Recall (0/5) 3  Orientation (0/6) 0  Total 10  Adjusted Score (based on education) 10    Objective:     PHYSICAL EXAMINATION:    VITALS:   Vitals:   01/25/22 0828  BP: 111/73  Pulse: 80  SpO2: 98%  Weight: 222 lb 9.6 oz (101 kg)  Height: 6' (1.829 m)    GEN:  The patient appears stated age and is in NAD. HEENT:  Normocephalic, atraumatic.   Neurological examination:  General: NAD, well-groomed, appears stated age. Orientation: The patient is alert. Oriented to person, place and date Cranial nerves: There is good facial symmetry.The speech is fluent and clear. No aphasia or dysarthria. Fund of knowledge is appropriate. Recent and remote memory are impaired. Attention and concentration are reduced.  Able to name objects and repeat phrases.  Hearing is intact to conversational tone.    Sensation: Sensation is intact to light touch throughout Motor: Strength is at least antigravity x4. DTR's 2/4 in UE/LE     Movement examination: Tone: There is normal tone in the UE/LE Abnormal movements:  no tremor.  No myoclonus.  No asterixis.   Coordination:  There is no decremation with RAM's. Normal finger to nose  Gait and Station: The patient has no difficulty arising out of a deep-seated chair without the use of the hands. The patient's stride length is good.  Gait is cautious and narrow.    Thank you for allowing [place] the opportunity to participate in the care of this  nice patient. Please do not hesitate to contact us for any questions or concerns.   Total time spent on today's visit was 43 minutes dedicated to this patient today, preparing to see patient, examining the patient, ordering tests and/or medications and counseling the patient, documenting clinical information in the EHR or other health record, independently interpreting results and communicating results to the patient/family, discussing treatment and goals, answering patient's questions and coordinating care.  Cc:  Idelle Crouch, MD  Sharene Butters 01/25/2022 10:42 AM

## 2022-01-25 NOTE — Patient Instructions (Addendum)
It was a pleasure to see you today at our office.   Recommendations:  Follow up in  6 months Continue donepezil 10 mg daily ( 2x5 mg pills daily )  Continue Speech Therapy, use your tablet  Continue CPAP  Whom to call:  Memory  decline, memory medications: Call our office [phone]   For psychiatric meds, mood meds: Please have your primary care physician manage these medications.     For assessment of decision of mental capacity and competency:  Call Dr. Anthoney Harada, geriatric psychiatrist at [phone]  For guidance in geriatric dementia issues please call [facility] [phone]    If you have any severe symptoms of a stroke, or other severe issues such as confusion,severe chills or fever, etc call 911 or go to the ER as you may need to be evaluated further    RECOMMENDATIONS FOR ALL PATIENTS WITH MEMORY PROBLEMS: 1. Continue to exercise (Recommend 30 minutes of walking everyday, or 3 hours every week) 2. Increase social interactions - continue going to [employer] and enjoy social gatherings with friends and family 3. Eat healthy, avoid fried foods and eat more fruits and vegetables 4. Maintain adequate blood pressure, blood sugar, and blood cholesterol level. Reducing the risk of stroke and cardiovascular disease also helps promoting better memory. 5. Avoid stressful situations. Live a simple life and avoid aggravations. Organize your time and prepare for the next day in anticipation. 6. Sleep well, avoid any interruptions of sleep and avoid any distractions in the bedroom that may interfere with adequate sleep quality 7. Avoid sugar, avoid sweets as there is a strong link between excessive sugar intake, diabetes, and cognitive impairment We discussed the Mediterranean diet, which has been shown to help patients reduce the risk of progressive memory disorders and reduces cardiovascular risk. This includes eating fish, eat fruits and green leafy vegetables, nuts  like almonds and hazelnuts, walnuts, and also use olive oil. Avoid fast foods and fried foods as much as possible. Avoid sweets and sugar as sugar use has been linked to worsening of memory function.  There is always a concern of gradual progression of memory problems. If this is the case, then we may need to adjust level of care according to patient needs. Support, both to the patient and caregiver, should then be put into place.    FALL PRECAUTIONS: Be cautious when walking. Scan the area for obstacles that may increase the risk of trips and falls. When getting up in the mornings, sit up at the edge of the bed for a few minutes before getting out of bed. Consider elevating the bed at the head end to avoid drop of blood pressure when getting up. Walk always in a well-lit room (use night lights in the walls). Avoid area rugs or power cords from appliances in the middle of the walkways. Use a walker or a cane if necessary and consider physical therapy for balance exercise. Get your eyesight checked regularly.  FINANCIAL OVERSIGHT: Supervision, especially oversight when making financial decisions or transactions is also recommended.  HOME SAFETY: Consider the safety of the kitchen when operating appliances like stoves, microwave oven, and blender. Consider having supervision and share cooking responsibilities until no longer able to participate in those. Accidents with firearms and other hazards in the house should be identified and addressed as well.   ABILITY TO BE LEFT ALONE: If patient is unable to contact 911 operator, consider using LifeLine, or when the need is there, arrange for someone  to stay with patients. Smoking is a fire hazard, consider supervision or cessation. Risk of wandering should be assessed by caregiver and if detected at any point, supervision and safe proof recommendations should be instituted.  MEDICATION SUPERVISION: Inability to self-administer medication needs to be  constantly addressed. Implement a mechanism to ensure safe administration of the medications.

## 2022-01-28 ENCOUNTER — Ambulatory Visit: Payer: Medicare HMO | Admitting: Speech Pathology

## 2022-01-28 ENCOUNTER — Telehealth: Payer: Self-pay | Admitting: Pharmacy Technician

## 2022-01-28 ENCOUNTER — Other Ambulatory Visit (HOSPITAL_COMMUNITY): Payer: Self-pay

## 2022-01-28 NOTE — Telephone Encounter (Signed)
Patient Advocate Encounter  Prior Authorization for Donepezil HCl 5MG  tablets has been approved.    PA# [id number] Key: [id number] Effective dates: 01/28/2022 through 01/14/2023      Lyndel Safe, [place] Patient Advocate Specialist [place] Patient Advocate Team Direct Number: [phone]  Fax: [phone]

## 2022-02-05 ENCOUNTER — Encounter: Payer: Medicare HMO | Admitting: Speech Pathology

## 2022-07-26 ENCOUNTER — Ambulatory Visit (INDEPENDENT_AMBULATORY_CARE_PROVIDER_SITE_OTHER): Payer: Medicare HMO | Admitting: Physician Assistant

## 2022-07-26 ENCOUNTER — Encounter: Payer: Self-pay | Admitting: Physician Assistant

## 2022-07-26 VITALS — BP 110/80 | HR 76 | Resp 18 | Ht 72.0 in

## 2022-07-26 DIAGNOSIS — F0280 Dementia in other diseases classified elsewhere without behavioral disturbance: Secondary | ICD-10-CM | POA: Diagnosis not present

## 2022-07-26 DIAGNOSIS — G3109 Other frontotemporal dementia: Secondary | ICD-10-CM | POA: Diagnosis not present

## 2022-07-26 DIAGNOSIS — G3101 Pick's disease: Secondary | ICD-10-CM

## 2022-07-26 MED ORDER — MEMANTINE HCL 5 MG PO TABS: ORAL_TABLET | ORAL | 11 refills | Status: DC

## 2022-07-26 NOTE — Patient Instructions (Signed)
It was a pleasure to see you today at our office.   Recommendations:  Follow up in  6 months Continue donepezil 10 mg daily.   Start memantine 5 mg, take 1 tab at night for 2 weeks, then increase to 1 tab twice a day   For guidance regarding [facility] and if placement were needed at the facility, contact Sidney Ace, Social Worker tel: [phone]   For assessment of decision of mental capacity and competency:  Call Dr. Erick Blinks, geriatric psychiatrist at [phone] Counseling regarding caregiver distress, including caregiver depression, anxiety and issues regarding community resources, adult day care programs, adult living facilities, or memory care questions:  please contact your  Primary Doctor's Social Worker   Whom to call: Memory  decline, memory medications: Call our office [phone]   For psychiatric meds, mood meds: Please have your primary care physician manage these medications.  If you have any severe symptoms of a stroke, or other severe issues such as confusion,severe chills or fever, etc call 911 or go to the ER as you may need to be evaluated further   [email or web address]   DRIVING: Regarding driving, in patients with progressive memory problems, driving will be impaired. We advise to have someone else do the driving if trouble finding directions or if minor accidents are reported. Independent driving assessment is available to determine safety of driving.   If you are interested in the driving assessment, you can contact the following:  [employer] in [place] [phone]  [facility] [phone]  [facility] [phone]  [facility] [phone] or [phone]     RECOMMENDATIONS FOR ALL PATIENTS WITH MEMORY PROBLEMS: 1. Continue to exercise (Recommend 30 minutes of walking everyday, or 3 hours every week) 2. Increase  social interactions - continue going to [place] and enjoy social gatherings with friends and family 3. Eat healthy, avoid fried foods and eat more fruits and vegetables 4. Maintain adequate blood pressure, blood sugar, and blood cholesterol level. Reducing the risk of stroke and cardiovascular disease also helps promoting better memory. 5. Avoid stressful situations. Live a simple life and avoid aggravations. Organize your time and prepare for the next day in anticipation. 6. Sleep well, avoid any interruptions of sleep and avoid any distractions in the bedroom that may interfere with adequate sleep quality 7. Avoid sugar, avoid sweets as there is a strong link between excessive sugar intake, diabetes, and cognitive impairment We discussed the Mediterranean diet, which has been shown to help patients reduce the risk of progressive memory disorders and reduces cardiovascular risk. This includes eating fish, eat fruits and green leafy vegetables, nuts like almonds and hazelnuts, walnuts, and also use olive oil. Avoid fast foods and fried foods as much as possible. Avoid sweets and sugar as sugar use has been linked to worsening of memory function.  There is always a concern of gradual progression of memory problems. If this is the case, then we may need to adjust level of care according to patient needs. Support, both to the patient and caregiver, should then be put into place.    FALL PRECAUTIONS: Be cautious when walking. Scan the area for obstacles that may increase the risk of trips and falls. When getting up in the mornings, sit up at the edge of the bed for a few minutes before getting out of bed. Consider elevating the bed at the head end to avoid drop of blood pressure when getting up. Walk always in a  well-lit room (use night lights in the walls). Avoid area rugs or power cords from appliances in the middle of the walkways. Use a walker or a cane if necessary and consider physical therapy for  balance exercise. Get your eyesight checked regularly.  FINANCIAL OVERSIGHT: Supervision, especially oversight when making financial decisions or transactions is also recommended.  HOME SAFETY: Consider the safety of the kitchen when operating appliances like stoves, microwave oven, and blender. Consider having supervision and share cooking responsibilities until no longer able to participate in those. Accidents with firearms and other hazards in the house should be identified and addressed as well.   ABILITY TO BE LEFT ALONE: If patient is unable to contact 911 operator, consider using LifeLine, or when the need is there, arrange for someone to stay with patients. Smoking is a fire hazard, consider supervision or cessation. Risk of wandering should be assessed by caregiver and if detected at any point, supervision and safe proof recommendations should be instituted.  MEDICATION SUPERVISION: Inability to self-administer medication needs to be constantly addressed. Implement a mechanism to ensure safe administration of the medications.

## 2022-07-26 NOTE — Progress Notes (Signed)
Assessment/Plan:   Frontotemporal Dementia (FTD) without Behavioral Disturbance  Primary Progressive Aphasia (PPA), logopenic type  Chad Chad [name] is a very pleasant 52 y.o. RH male with a history of hypertension, hyperlipidemia, glaucoma, depression, 1 cm L frontal meningioma, migraine headaches,  OSA on CPAP, seen today for evaluation of memory loss. He had initially been seen at [facility] in [place], [place],  Dr. Tanna Chad [name] (11/24/2020) with findings suspicious for FTD, MRI showing R temporal, R frontal, Left Frontal atrophy.  PET scan has been unable to be performed due to insurance denial.  LP 07/25/2021 remarkable for 14 3 3  at 5300, T Tau 211, Ptau (181P) 15.6,  PTau/a beta 42 ratio 0.015 (not consistent with Chad presence of changes associated with Alzheimer's disease).  He was seen by neuropsychology, yielding a diagnosis of likely FTD with PPA logopenic type. He is seen today in follow up for memory loss. Chad [name] is currently on donepezil 10 mg daily, tolerating well.  There is mild cognitive decline noted, but he still able to participate in ADLs and to live alone.   Follow up in 6 months. Continue donepezil 10 mg daily . Side effects were discussed  Start memantine 5 mg twice daily, side effects discussed Continue speech therapy at home Recommend good control of her cardiovascular risk factors Continue to control mood as per PCP    Subjective:    Chad Chad [name] is accompanied in Chad office by his mother who supplements Chad history.  Previous records as well as any outside records available were reviewed prior to todays visit. Chad [name] was last seen on 01/25/2022 with an MMSE of 17/30.   Any changes in memory since last visit? "His memory is not good, he gets more confused than before"   There are some good and bad days ".  Short-term is worse than long-term memory.  He does not like to use his pad to communicate as he did before unless he has  difficulty with neighbors. He likes to sing songs, especially Country. repeats oneself?  Denies  Disoriented when walking into a room?  Chad [name] denies    Leaving objects in unusual places?  May misplace things, such as his keys, phone, glasses,  but not in unusual places   Wandering behavior?  denies   Any personality changes since last visit?  denies   Any worsening depression?:  "He may get upset sometimes when he cannot remember something " hallucinations or paranoia?  denies   Seizures? denies    Any sleep change?  Sleeps well.  Denies vivid dreams, REM behavior or sleepwalking    Sleep apnea?  Endorsed, uses CPAP. Any hygiene Chad [name]?" He does not want to take showers" Independent of bathing and dressing? "Mixes clothing, does not change during Chad day" Does Chad Chad [name] needs help with medications?  His sister  is in charge. Who is in charge of Chad finances? Sister  is in charge     Any changes in appetite?   Sometimes he forgets that he ate and he eats again, he is gaining too much weight according to mother Chad [name] have trouble swallowing? denies   Does Chad Chad [name] cook? No Any headaches?   denies   Chronic back pain  denies   Ambulates with difficulty?  Likes to walk when weather permits.  Recent falls or head injuries? denies     Unilateral weakness, numbness or tingling? denies   Any tremors?  denies   Any anosmia?  Chad [name] denies  Any incontinence of urine?  He has frequent urination because he drinks plenty water throughout Chad day. Wears Depends Any bowel dysfunction?     denies      Chad [name] lives alone without difficulties.  Does Chad Chad [name] drive? No longer drives     Neurocognitive testing 06/07/21 Dr. Milbert Coulter  Major Neurocognitive Disorder ("dementia") at Chad present time.  Chad most likely etiology is Chad presence of frontotemporal lobar degeneration leading to a Chad [name] of frontotemporal dementia (FTD). Chad is most strongly evidenced by neuroimaging, which has  suggested Chad [name] and progressive atrophy surrounding Chad frontal and temporal lobes. FTD is best described as an umbrella term which encompasses several different presentations. These are commonly grouped into Chad behavioral variant and several language variants. Given that Chad Chad [name] surrounding impulsivity, behavioral disinhibition, or severe personality changes, his Chad [name] would not align with Chad behavioral variant of FTD. However, he does exhibit many symptoms of a language variant of Chad illness, commonly referred to as a primary progressive aphasia (PPA) Chad [name]. Chad Chad [name] notably effortful speech, impaired fluency, and agrammatism found within writing samples would best align with Chad non-fluent primary progressive aphasia subtype. However, comprehension difficulties were also quite significant, suggesting that he also may display features of Chad semantic dementia subtype. Recent neuroimaging seems to align better with former subtype relative to Chad latter. However, it certainly remains plausible that features of both are co-occurring. Chad Chad [name] given Chad extent of diffuse impairment. While it can be common to see language and executive functioning deficits in any FTD Chad [name], Chad will eventually spread to all areas of Chad brain as Chad disease progresses.      Initial visit 05/01/2021 Chad Chad [name] is seen in neurologic consultation at Chad request of Chad Arbour, MD for Chad evaluation of memory.  Chad Chad [name] is accompanied by his mother who supplements Chad history. Chad is a 52 y.o. year old RH  male who has had memory issues for about 3 years.  However, about 1 year ago, Chad Chad [name] began having problems with his job performance, requiring to stop working because he could not follow instructions unless a task were being shown to him first.  He also noted that he could not hold a tool  properly for its use.  Eventually, he needed assistance to pay bills or to remember to take his medications, his sister is now in charge.  He now struggles remembering days, names of places and people, especially over Chad last year.  He is now having some difficulty with language, "having a hard time thinking of Chad word, taking longer than before ".  He denies repeating himself.  He denies living objects in unusual places, but he does lose things.  He is also showing repetitive behavior, like playing with objects.  He has increasing dependence.  He denies any Chad [name] for seizures (although he had seizures at age 15 after a febrile illness without recurrence), myoclonus, tremor, or sensory loss.  No history of TBI. He denies any significant mood changes, although he is on antidepressants.  He denies any hallucinations, impulsivity, or irritability.  His major symptoms have been increasing fatigue, daytime sleepiness, mild gait instability and slower pace, without weakness, rigidity or tremors.  No history of stroke. He is on physical therapy.  At night, he reports awakening more than 3 times per night, with nocturia, but he denies any REM behavior, or vivid dreams.  He  has a history of right temporal headaches, denies double vision, dizziness, focal numbness or tingling, unilateral weakness.  Denies alcohol or tobacco.  He has a history of OSA, his mother states that he needs CPAP, but Chad Chad [name] denies.  No wandering behavior.  He no longer drives.  No hygiene Chad [name].Family History remarkable for grandmother with Alzheimer's disease     MRI brain with and without contrast 04/09/2021: 1. Findings concerning for frontotemporal degeneration with mild interval progression of volume loss since prior MRI, more noticeable in Chad right temporal lobe. 2. Inflammatory paranasal sinus disease. Progressing compared to prior MRI.  PREVIOUS MEDICATIONS:   CURRENT MEDICATIONS:  Outpatient Encounter Medications as of  07/26/2022  Medication Sig   albuterol (VENTOLIN HFA) 108 (90 Base) MCG/ACT inhaler INHALE 1 TO 2 PUFFS BY MOUTH EVERY 6 HOURS AS NEEDED FOR COUGH   amLODipine (NORVASC) 5 MG tablet Take 5 mg by mouth daily.   amphetamine-dextroamphetamine (ADDERALL) 10 MG tablet Take 10 mg by mouth daily with breakfast.    Ascorbic Acid (VITAMIN C) 500 MG CAPS Take by mouth.   aspirin EC 81 MG tablet Take 81 mg by mouth daily. Swallow whole.   bisoprolol (ZEBETA) 5 MG tablet Take 1 tablet by mouth daily.   donepezil (ARICEPT) 5 MG tablet Take 2 tablets (10 mg total) by mouth at bedtime.   guaiFENesin (MUCINEX) 600 MG 12 hr tablet Take 600 mg by mouth 2 (two) times daily.   latanoprost (XALATAN) 0.005 % ophthalmic solution Place 1 drop into both eyes at bedtime.    Magnesium 250 MG TABS Take 250 mg by mouth daily.   memantine (NAMENDA) 5 MG tablet Take one tab at night for 2 weeks, then 1 tab 5mg  twice a day   Multiple Vitamin (MULTIVITAMIN) tablet Take 1 tablet by mouth daily.   Naproxen Sodium (ALEVE PO) Take 500 mg elemental calcium/kg/hr by mouth daily as needed.   rosuvastatin (CRESTOR) 10 MG tablet Take 1 tablet (10 mg total) by mouth daily.   Turmeric 500 MG CAPS Take by mouth.   vitamin B-12 (CYANOCOBALAMIN) 500 MCG tablet Take 500 mcg by mouth daily.   nortriptyline (PAMELOR) 25 MG capsule Take 25 mg by mouth at bedtime.   venlafaxine XR (EFFEXOR-XR) 75 MG 24 hr capsule Take 75 mg by mouth daily with breakfast.    [DISCONTINUED] olmesartan (BENICAR) 40 MG tablet Take by mouth.   No facility-administered encounter medications on file as of 07/26/2022.       07/26/2022   11:00 AM 01/25/2022   10:00 AM  MMSE - Mini Mental State Exam  Orientation to time 3 2  Orientation to Place 4 4  Registration 3 2  Attention/ Calculation 0 0  Recall 0 2  Language- name 2 objects 2 2  Language- repeat 1 1  Language- follow 3 step command 3 3  Language- read & follow direction 1 1  Write a sentence 0 0   Copy design 0 0  Total score 17 17      05/02/2021    6:00 AM  Montreal Cognitive Assessment   Visuospatial/ Executive (0/5) 1  Naming (0/3) 2  Attention: Read list of digits (0/2) 0  Attention: Read list of letters (0/1) 1  Attention: Serial 7 subtraction starting at 100 (0/3) 0  Language: Repeat phrase (0/2) 2  Language : Fluency (0/1) 0  Abstraction (0/2) 1  Delayed Recall (0/5) 3  Orientation (0/6) 0  Total 10  Adjusted Score (based on  education) 10    Objective:     PHYSICAL EXAMINATION:    VITALS:   Vitals:   07/26/22 0834  BP: 110/80  Pulse: 76  Resp: 18  SpO2: 98%  Height: 6' (1.829 m)    GEN:  Chad Chad [name] appears stated age and is in NAD. HEENT:  Normocephalic, atraumatic.   Neurological examination:  General: NAD, well-groomed, appears stated age. Orientation: Chad Chad [name] is alert. Oriented to person, place and not to date  cranial nerves: There is good facial symmetry. Chad speech is nonfluent, soft-spoken, but clear.  Some aphasia is noted.  No dysarthria.  Fund of knowledge is reduced.  Recent and remote memory are impaired. Attention and concentration are reduced.  Able to name objects and he still able to repeat phrases.  Hearing is intact to conversational tone.   Sensation: Sensation is intact to light touch throughout Motor: Strength is at least antigravity x4. DTR's 2/4 in UE/LE     Movement examination: Tone: There is normal tone in Chad UE/LE Abnormal movements:  no tremor.  No myoclonus.  No asterixis.   Coordination:  There is no decremation with RAM's. Normal finger to nose  Gait and Station: Chad Chad [name] has no difficulty arising out of a deep-seated chair without Chad use of Chad hands. Chad Chad [name]'s stride length is good.  Gait is cautious, slower than prior, and narrow.    Thank you for allowing [place] Chad opportunity to participate in Chad care of Chad nice Chad [name]. Please do not hesitate to contact us for any questions or Chad [name].    Total time spent on today's visit was 25 minutes dedicated to Chad Chad [name] today, preparing to see Chad [name], examining Chad Chad [name], ordering tests and/or medications and counseling Chad Chad [name], documenting clinical information in Chad EHR or other health record, independently interpreting results and communicating results to Chad Chad [name]/family, discussing treatment and goals, answering Chad [name]'s questions and coordinating care.  Cc:  Chad Arbour, MD  Marlowe Kays 07/26/2022 12:04 PM

## 2023-01-27 ENCOUNTER — Ambulatory Visit (INDEPENDENT_AMBULATORY_CARE_PROVIDER_SITE_OTHER): Payer: Medicare HMO | Admitting: Physician Assistant

## 2023-01-27 ENCOUNTER — Encounter: Payer: Self-pay | Admitting: Physician Assistant

## 2023-01-27 VITALS — BP 103/71 | HR 75 | Resp 20 | Ht 72.0 in | Wt 235.0 lb

## 2023-01-27 DIAGNOSIS — F0280 Dementia in other diseases classified elsewhere without behavioral disturbance: Secondary | ICD-10-CM | POA: Diagnosis not present

## 2023-01-27 DIAGNOSIS — G3109 Other frontotemporal dementia: Secondary | ICD-10-CM

## 2023-01-27 DIAGNOSIS — G3101 Pick's disease: Secondary | ICD-10-CM | POA: Diagnosis not present

## 2023-01-27 MED ORDER — MEMANTINE HCL 10 MG PO TABS: ORAL_TABLET | ORAL | 11 refills | Status: AC

## 2023-01-27 MED ORDER — DONEPEZIL HCL 5 MG PO TABS: 10.0000 mg | ORAL_TABLET | Freq: Every day | ORAL | 11 refills | Status: DC

## 2023-01-27 NOTE — Patient Instructions (Addendum)
 It was a pleasure to see you today at our office.   Recommendations:  Follow up in  6 months Continue donepezil  10 mg daily.   Increase memantine  to 10 mg twice a day.   For guidance regarding [facility] and if placement were needed at the facility, contact Nat Hock, Social Worker tel: [phone]   For assessment of decision of mental capacity and competency:  Call Dr. Rosaline Nine, geriatric psychiatrist at [phone] Counseling regarding caregiver distress, including caregiver depression, anxiety and issues regarding community resources, adult day care programs, adult living facilities, or memory care questions:  please contact your  Primary Doctor's Social Worker   Whom to call: Memory  decline, memory medications: Call our office [phone]   For psychiatric meds, mood meds: Please have your primary care physician manage these medications.  If you have any severe symptoms of a stroke, or other severe issues such as confusion,severe chills or fever, etc call 911 or go to the ER as you may need to be evaluated further   [email or web address]  RECOMMENDATIONS FOR ALL PATIENTS WITH MEMORY PROBLEMS: 1. Continue to exercise (Recommend 30 minutes of walking everyday, or 3 hours every week) 2. Increase social interactions - continue going to [place] and enjoy social gatherings with friends and family 3. Eat healthy, avoid fried foods and eat more fruits and vegetables 4. Maintain adequate blood pressure, blood sugar, and blood cholesterol level. Reducing the risk of stroke and cardiovascular disease also helps promoting better memory. 5. Avoid stressful situations. Live a simple life and avoid aggravations. Organize your time and prepare for the next day in anticipation. 6. Sleep well, avoid any interruptions of sleep and avoid any distractions in the bedroom that may interfere with adequate sleep  quality 7. Avoid sugar, avoid sweets as there is a strong link between excessive sugar intake, diabetes, and cognitive impairment We discussed the Mediterranean diet, which has been shown to help patients reduce the risk of progressive memory disorders and reduces cardiovascular risk. This includes eating fish, eat fruits and green leafy vegetables, nuts like almonds and hazelnuts, walnuts, and also use olive oil. Avoid fast foods and fried foods as much as possible. Avoid sweets and sugar as sugar use has been linked to worsening of memory function.  There is always a concern of gradual progression of memory problems. If this is the case, then we may need to adjust level of care according to patient needs. Support, both to the patient and caregiver, should then be put into place.    FALL PRECAUTIONS: Be cautious when walking. Scan the area for obstacles that may increase the risk of trips and falls. When getting up in the mornings, sit up at the edge of the bed for a few minutes before getting out of bed. Consider elevating the bed at the head end to avoid drop of blood pressure when getting up. Walk always in a well-lit room (use night lights in the walls). Avoid area rugs or power cords from appliances in the middle of the walkways. Use a walker or a cane if necessary and consider physical therapy for balance exercise. Get your eyesight checked regularly.  FINANCIAL OVERSIGHT: Supervision, especially oversight when making financial decisions or transactions is also recommended.  HOME SAFETY: Consider the safety of the kitchen when operating appliances like stoves, microwave oven, and blender. Consider having supervision and share cooking responsibilities until no longer able to participate in those. Accidents with firearms and other hazards in the  house should be identified and addressed as well.   ABILITY TO BE LEFT ALONE: If patient is unable to contact 911 operator, consider using [employer], or  when the need is there, arrange for someone to stay with patients. Smoking is a fire hazard, consider supervision or cessation. Risk of wandering should be assessed by caregiver and if detected at any point, supervision and safe proof recommendations should be instituted.  MEDICATION SUPERVISION: Inability to self-administer medication needs to be constantly addressed. Implement a mechanism to ensure safe administration of the medications.

## 2023-01-27 NOTE — Progress Notes (Signed)
 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 RH male with a history of hypertension, hyperlipidemia, glaucoma, depression, 1 cm L frontal meningioma, migraine headaches,  OSA on CPAP seen today in follow up for memory loss. He had initially been seen at [facility] at [facility] in [place], [place],  Dr. Charlie Stone (11/24/2020) with findings suspicious for FTD, MRI showing R temporal, R frontal, Left Frontal atrophy.  PET scan has been unable to be performed due to insurance denial.  LP 07/25/2021 remarkable for 14 3 at 5300, T Tau 211, Ptau (181P) 15.6,  PTau/a beta 42 ratio 0.015 (not consistent with the presence of changes associated with Alzheimer's disease).  He was seen by neuropsychology, yielding a diagnosis of likely FTD with PPA logopenic type.  He is on donepezil  10 mg daily and memantine  5 mg twice daily, tolerating well.  Unfortunately, cognitive decline is noted in today's visit.  He attends adult day program 4 times a week, although it is becoming more difficult -mother says.  He is less verbal than before.  He needs more assistance with his ADLs than prior.  Discussed with his mother increasing memantine  to 10 mg twice a day in an effort o slow down worsening of memory, his mother agrees.   Follow up in 6 months. Continue donepezil  10 mg daily and increase memantine  to 10 mg  twice daily, side effects discussed. Agree with adult day program Continue speech therapy at home Recommend good control of her cardiovascular risk factors Continue to control mood as per PCP     Subjective:    This patient is accompanied in the office by his mother who supplements the history.  Previous records as well as any outside records available were reviewed prior to todays visit. Patient was last seen on 07/26/2022.  MMSE at the time was 17/30.     Any changes in memory since last visit? Worse over the last 2 months.  There are some good and bad days.  STM is worse than LTM although this is still affected.  He does not like using the pad to communicate.  He likes to listen to country music. He goes to [facility] days a week for social and cognitive stimulation. repeats oneself?  Denies Disoriented when walking into a room?  Patient denies   Leaving objects?  May misplace things such as the phone or glasses but not in unusual places   Wandering behavior?  Denies.   Any personality changes since last visit?   He accuses the mother keeping him hostage at the adult day program, he may have moments of irritability Any worsening depression?:  Denies.   Hallucinations or paranoia?  Denies.   Seizures? Denies.    Any sleep changes? Sleeps well, Denies vivid dreams, REM behavior or sleepwalking   Sleep apnea?  Endorsed, uses a CPAP. Any hygiene concerns?  As before, he does not want to take showers but his mother makes sure that he is under control. Independent of bathing and dressing?  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 Does the patient needs help with medications?  Sister is in charge  Who is in charge of the finances?  Sister is in charge    Any changes in appetite?  Sometimes he forgets he ate and he eats again.  He  is forgetting how to use utensils, he is eating with his hands more often. Patient have trouble swallowing? Denies.   Does the patient cook? No Any headaches?   Denies.   Chronic back pain  denies.   Ambulates with difficulty?  Depending on the weather, he tries to walk frequently.  Recent falls or head injuries? Denies.     Unilateral weakness, numbness or tingling? Denies.   Any tremors?  Denies   Any anosmia?  Denies   Any incontinence of urine?  Endorsed, wears diapers Any bowel dysfunction?   Denies      Patient lives  with mother and  sister supervision  Does the patient drive? No longer drives    Neurocognitive testing 06/07/21 Dr. Richie  Major Neurocognitive Disorder (dementia) at the present time.  The most likely etiology is the presence of frontotemporal lobar degeneration leading to a presentation of frontotemporal dementia (FTD). This is most strongly evidenced by neuroimaging, which has suggested advanced and progressive atrophy surrounding the frontal and temporal lobes. FTD is best described as an umbrella term which encompasses several different presentations. These are commonly grouped into the behavioral variant and several language variants. Given that Chad Stone and his mother denied any concerns surrounding impulsivity, behavioral disinhibition, or severe personality changes, his presentation would not align with the behavioral variant of FTD. 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 However, comprehension difficulties were also quite significant, suggesting that he also may display features of the semantic dementia subtype. Recent neuroimaging seems to align better with former subtype relative to the latter. However, it certainly remains plausible that features of both are co-occurring. Chad Stone dementia presentation would appear to be fairly advanced given the extent of diffuse impairment. While it can be common to see language and executive functioning deficits in any FTD presentation, this will eventually spread to all areas of the brain as the disease progresses.      Initial visit 05/01/2021 the patient is seen in neurologic consultation at the request of Chad Reyes BIRCH, MD for the evaluation of memory.  The patient is accompanied  by his mother who supplements the history. This is a 53 y.o. year old RH  male who has had memory issues for about 3 years.  However, about 1 year ago, the patient began having problems with his job performance, requiring to stop working because he could not follow instructions unless a task were being shown to him first.  He also noted that he could not hold a tool properly for its use.  Eventually, he needed assistance to pay bills or to remember to take his medications, his sister is now in charge.  He now struggles remembering days, names of places and people, especially over the last year.  He is now having some difficulty with language, having a hard time thinking of the word, taking longer than before .  He denies repeating himself.  He denies living objects in unusual places, but he does lose things.  He is also showing repetitive behavior, like playing with objects.  He has increasing dependence.  He denies any concerns for seizures (although he had seizures at age 61 after a febrile illness without recurrence), myoclonus, tremor, or sensory loss.  No history of TBI. He denies  any significant mood changes, although he is on antidepressants.  He denies any hallucinations, impulsivity, or irritability.  His major symptoms have been increasing fatigue, daytime sleepiness, mild gait instability and slower pace, without weakness, rigidity or tremors.  No history of stroke. He is on physical therapy.  At night, he reports awakening more than 3 times per night, with nocturia, but he denies any REM behavior, or vivid dreams.  He has a history of right temporal headaches, denies double vision, dizziness, focal numbness or tingling, unilateral weakness.  Denies alcohol or tobacco.  He has a history of OSA, his mother states that he needs CPAP, but the patient denies.  No wandering behavior.  He no longer drives.  No hygiene concerns.Family History remarkable for grandmother with Alzheimer's disease     MRI  brain with and without contrast 04/09/2021: 1. Findings concerning for frontotemporal degeneration with mild interval progression of volume loss since prior MRI, more noticeable in the right temporal lobe. 2. Inflammatory paranasal sinus disease. Progressing compared to prior MRI.   PREVIOUS MEDICATIONS:   CURRENT MEDICATIONS:  Outpatient Encounter Medications as of 01/27/2023  Medication Sig   albuterol  (VENTOLIN  HFA) 108 (90 Base) MCG/ACT inhaler INHALE 1 TO 2 PUFFS BY MOUTH EVERY 6 HOURS AS NEEDED FOR COUGH   amLODipine (NORVASC) 5 MG tablet Take 5 mg by mouth daily.   amphetamine-dextroamphetamine (ADDERALL) 10 MG tablet Take 10 mg by mouth daily with breakfast.    Ascorbic Acid (VITAMIN C) 500 MG CAPS Take by mouth.   aspirin EC 81 MG tablet Take 81 mg by mouth daily. Swallow whole.   bisoprolol (ZEBETA) 5 MG tablet Take 1 tablet by mouth daily.   guaiFENesin (MUCINEX) 600 MG 12 hr tablet Take 600 mg by mouth 2 (two) times daily.   latanoprost (XALATAN) 0.005 % ophthalmic solution Place 1 drop into both eyes at bedtime.    Magnesium 250 MG TABS Take 250 mg by mouth daily.   Multiple Vitamin (MULTIVITAMIN) tablet Take 1 tablet by mouth daily.   Naproxen Sodium (ALEVE PO) Take 500 mg elemental calcium /kg/hr by mouth daily as needed.   nortriptyline (PAMELOR) 25 MG capsule Take 25 mg by mouth at bedtime.   rosuvastatin  (CRESTOR ) 10 MG tablet Take 1 tablet (10 mg total) by mouth daily.   Turmeric 500 MG CAPS Take by mouth.   vitamin B-12 (CYANOCOBALAMIN) 500 MCG tablet Take 500 mcg by mouth daily.   [DISCONTINUED] memantine  (NAMENDA ) 5 MG tablet Take one tab at night for 2 weeks, then 1 tab 5mg  twice a day   donepezil  (ARICEPT ) 5 MG tablet Take 2 tablets (10 mg total) by mouth at bedtime.   memantine  (NAMENDA ) 10 MG tablet Take one tab twice a day   venlafaxine XR (EFFEXOR-XR) 75 MG 24 hr capsule Take 75 mg by mouth daily with breakfast.    [DISCONTINUED] donepezil  (ARICEPT ) 5 MG tablet Take  2 tablets (10 mg total) by mouth at bedtime.   No facility-administered encounter medications on file as of 01/27/2023.       07/26/2022   11:00 AM 01/25/2022   10:00 AM  MMSE - Mini Mental State Exam  Orientation to time 3 2  Orientation to Place 4 4  Registration 3 2  Attention/ Calculation 0 0  Recall 0 2  Language- name 2 objects 2 2  Language- repeat 1 1  Language- follow 3 step command 3 3  Language- read & follow direction 1 1  Write a sentence 0  0  Copy design 0 0  Total score 17 17      05/02/2021    6:00 AM  Montreal Cognitive Assessment   Visuospatial/ Executive (0/5) 1  Naming (0/3) 2  Attention: Read list of digits (0/2) 0  Attention: Read list of letters (0/1) 1  Attention: Serial 7 subtraction starting at 100 (0/3) 0  Language: Repeat phrase (0/2) 2  Language : Fluency (0/1) 0  Abstraction (0/2) 1  Delayed Recall (0/5) 3  Orientation (0/6) 0  Total 10  Adjusted Score (based on education) 10    Objective:     PHYSICAL EXAMINATION:    VITALS:   Vitals:   01/27/23 0910  BP: 103/71  Pulse: 75  Resp: 20  SpO2: 98%  Weight: 235 lb (106.6 kg)  Height: 6' (1.829 m)    GEN:  The patient appears stated age and is in NAD. HEENT:  Normocephalic, atraumatic.   Neurological examination:  General: NAD, well-groomed, appears stated age. Orientation: The patient is alert. Oriented to person, not to place and date Cranial nerves: There is good facial symmetry.The speech is not fluent but clear, whispers.  Aphasia is noted, no dysarthria.  Fund of knowledge is reduced. Recent and remote memory are impaired. Attention and concentration are reduced.  Unable to name objects and repeat phrases.  Hearing is intact to conversational tone. Sensation: Sensation is intact to light touch throughout Motor: Strength is at least antigravity x4. DTR's 2/4 in UE/LE     Movement examination: Tone: There is normal tone in the UE/LE Abnormal movements:  no tremor.  No  myoclonus.  No asterixis.   Coordination:  There is decremation with RAM's.  Abnormal finger to nose  Gait and Station: The patient has more difficulty arising out of a deep-seated chair without the use of the hands. The patient's stride length is shorter than prior gait is cautious, slow and narrow.    Thank you for allowing us  the opportunity to participate in the care of this nice patient. Please do not hesitate to contact us  for any questions or concerns.   Total time spent on today's visit was 45 minutes dedicated to this patient today, preparing to see patient, examining the patient, ordering tests and/or medications and counseling the patient, documenting clinical information in the EHR or other health record, independently interpreting results and communicating results to the patient/family, discussing treatment and goals, answering patient's questions and coordinating care.  Cc:  Chad Reyes BIRCH, MD  Camie Sevin 01/27/2023 12:01 PM

## 2023-05-09 ENCOUNTER — Encounter: Payer: Self-pay | Admitting: Physician Assistant

## 2023-07-28 ENCOUNTER — Ambulatory Visit: Admitting: Physician Assistant

## 2023-07-28 ENCOUNTER — Ambulatory Visit: Payer: Medicare HMO | Admitting: Physician Assistant

## 2023-08-07 IMAGING — MR MR HEAD WO/W CM
16 series · 48 of 48 positions shown · IV contrast (10ml Gadavist)
Comparison: Head CT January 03, 2020; MRI of the brain [facility]

CLINICAL DATA: New onset of headaches after age 48
([id number]).

EXAM:
MRI HEAD WITHOUT AND WITH CONTRAST
TECHNIQUE: Multiplanar, multiecho pulse sequences of the brain and surrounding
structures were obtained without and with intravenous contrast.
CONTRAST:  10mL GADAVIST GADOBUTROL 1 MMOL/ML IV SOLN

[Series 5: ax dwi_tracew · axial · 3.0mm · 0.65mm/px · z∈[-66,+95]mm · 3 of 50 slices shown]
[im 1/50]
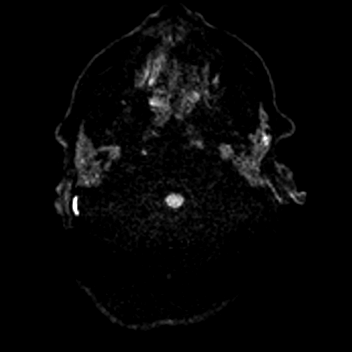
[im 25/50]
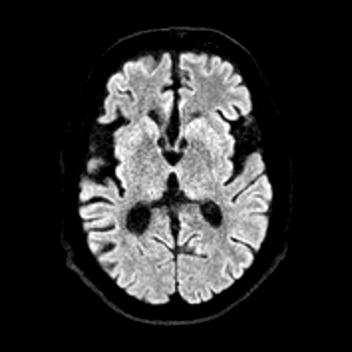
[im 50/50]
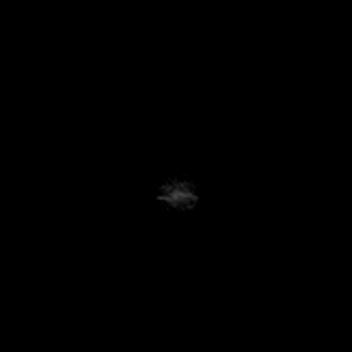

[Series 6: ax dwi_adc · axial · 3.0mm · 0.65mm/px · z∈[-66,+95]mm · 3 of 50 slices shown]
[im 1/50]
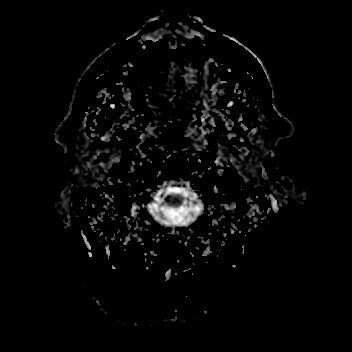
[im 25/50]
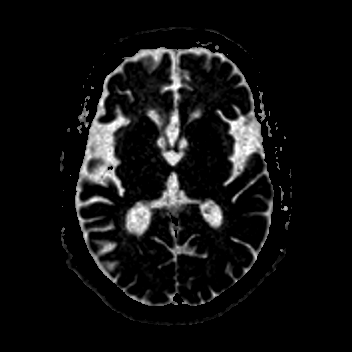
[im 50/50]
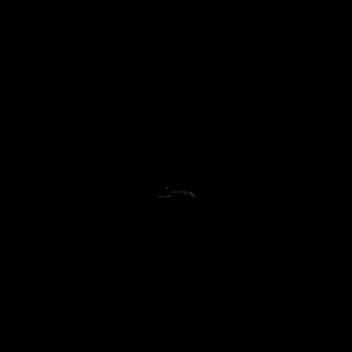

[Series 7: cor dwi_tracew · coronal · 5.0mm · 0.65mm/px · 2 of 40 slices shown]
[im 1/40]
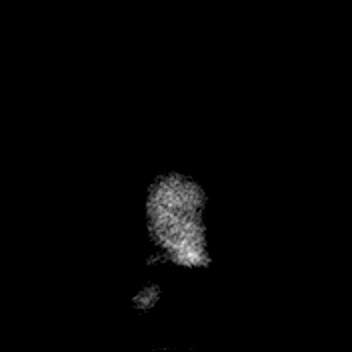
[im 40/40]
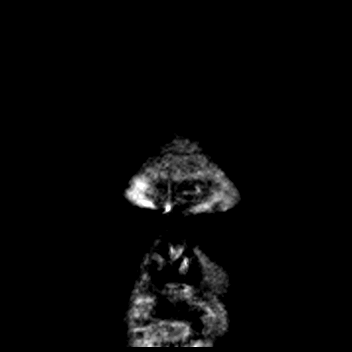

[Series 8: cor dwi_adc · coronal · 5.0mm · 0.65mm/px · 2 of 40 slices shown]
[im 1/40]
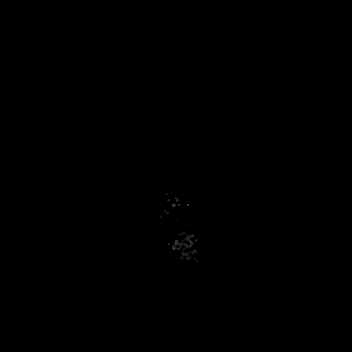
[im 40/40]
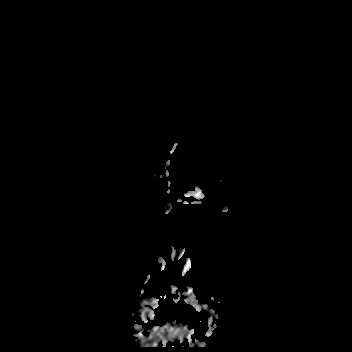

[Series 9: T1 · sagittal · 5.0mm · 0.62mm/px · 1 of 25 slices shown (1 of 2)]
[im 1/25]
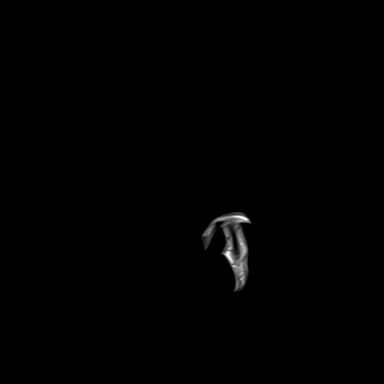

[Series 10: T2 · axial · 5.0mm · 0.53mm/px · 1 of 27 slices shown]
[im 1/27]
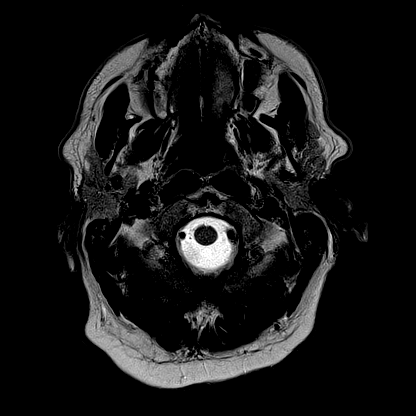

[Series 11: mag_images · axial · 3.0mm · 0.90mm/px · z∈[-73,+103]mm · 3 of 60 slices shown]
[im 1/60]
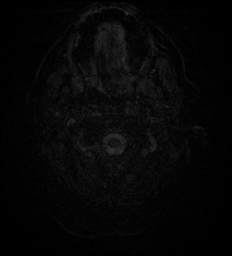
[im 30/60]
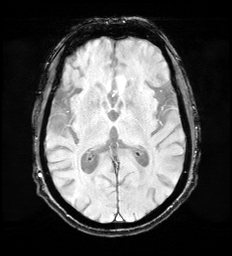
[im 60/60]
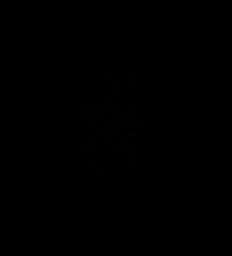

[Series 12: pha_images · axial · 3.0mm · 0.90mm/px · z∈[-73,+103]mm · 3 of 59 slices shown]
[im 1/59]
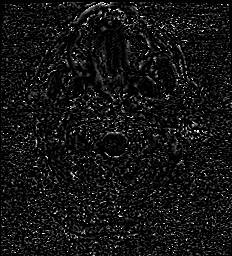
[im 30/59]
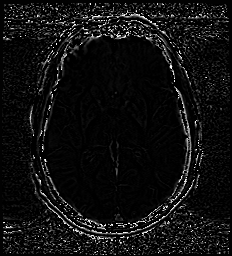
[im 59/59]
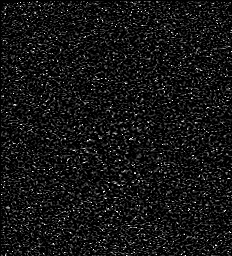

[Series 13: swi_images · axial · 3.0mm · 0.90mm/px · z∈[-73,+103]mm · 3 of 60 slices shown]
[im 1/60]
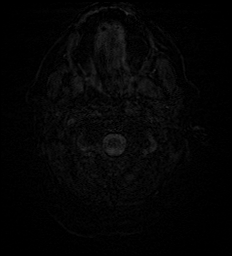
[im 30/60]
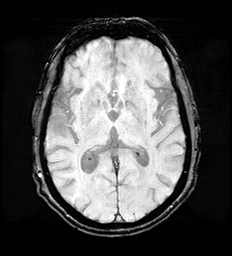
[im 60/60]
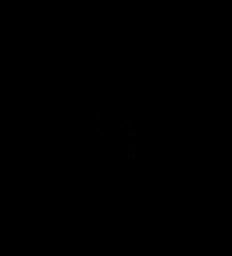

[Series 14: mip_images(sw) · axial · 24.0mm · 0.90mm/px · z∈[-63,+93]mm · 3 of 53 slices shown]
[im 1/53]
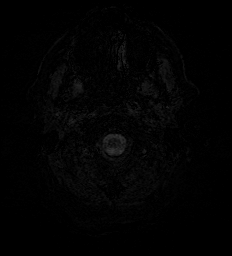
[im 27/53]
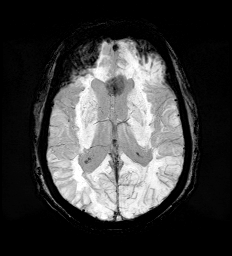
[im 53/53]
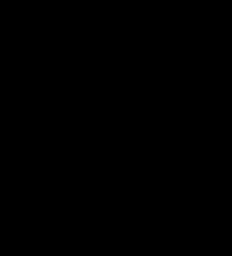

[Series 15: FLAIR · axial · 3.0mm · 0.53mm/px · z∈[-66,+96]mm · 3 of 55 slices shown]
[im 1/55]
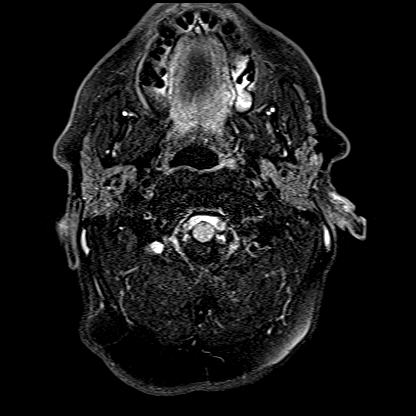
[im 28/55]
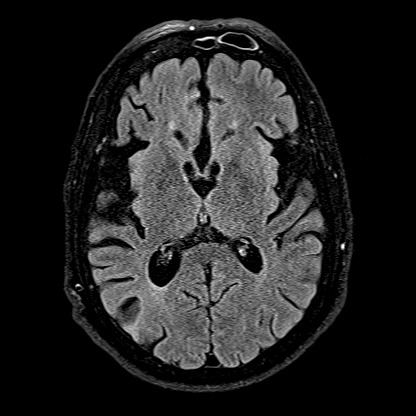
[im 55/55]
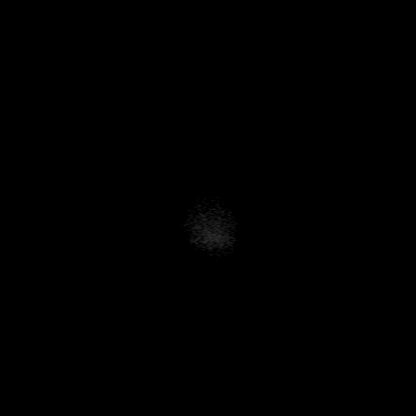

[Series 16: T1 · axial · 1.0mm · 0.98mm/px · z∈[-72,+101]mm · 9 of 175 slices shown (2 of 2)]
[im 1/175]
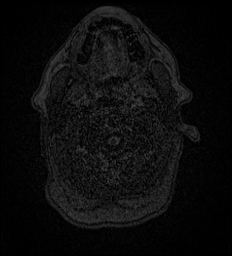
[im 22/175]
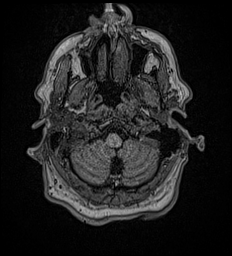
[im 44/175]
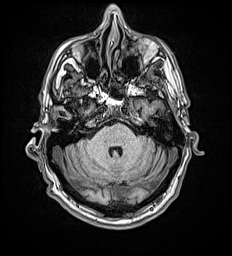
[im 66/175]
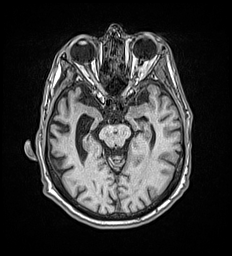
[im 88/175]
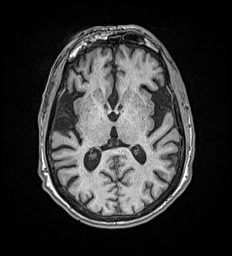
[im 109/175]
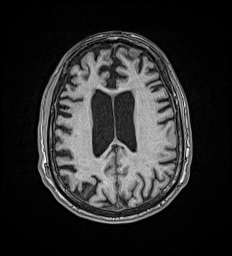
[im 131/175]
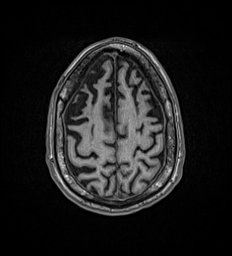
[im 153/175]
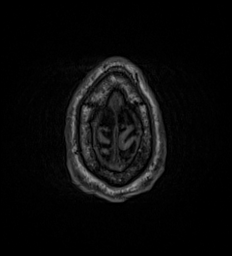
[im 175/175]
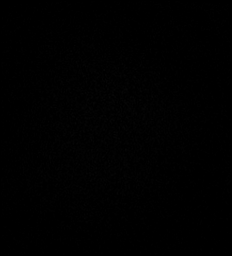

[Series 17: T2 post-contrast · coronal · 5.0mm · 0.57mm/px · 1 of 29 slices shown]
[im 1/29]
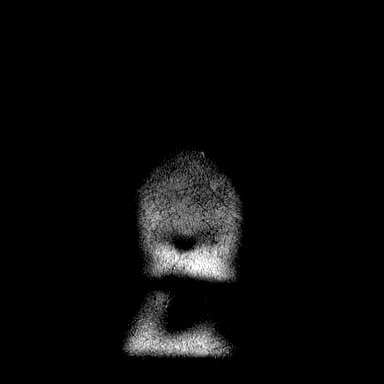

[Series 18: T1 post-contrast · axial · 1.0mm · 0.98mm/px · z∈[-72,+102]mm · 9 of 176 slices shown (1 of 3)]
[im 1/176]
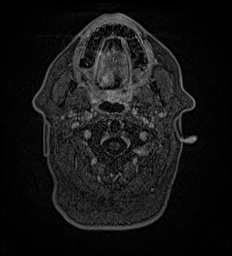
[im 22/176]
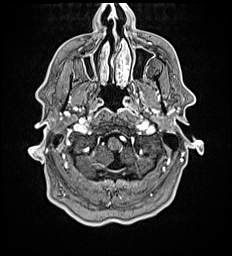
[im 44/176]
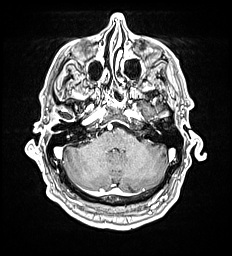
[im 66/176]
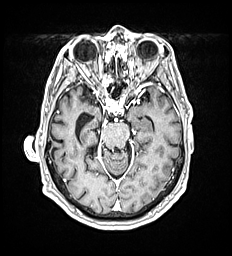
[im 88/176]
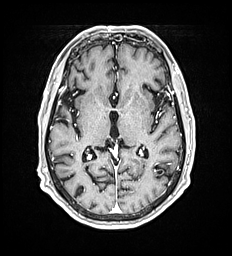
[im 110/176]
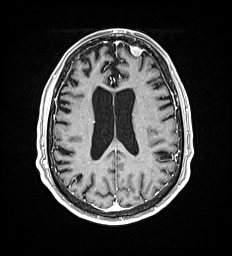
[im 132/176]
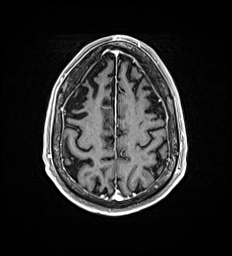
[im 154/176]
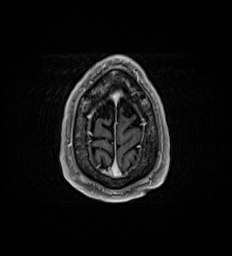
[im 176/176]
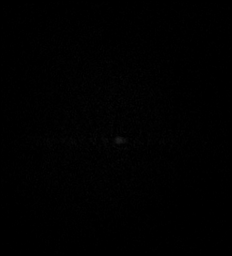

[Series 19: T1 post-contrast · coronal · 5.0mm · 0.57mm/px · 1 of 29 slices shown (2 of 3)]
[im 1/29]
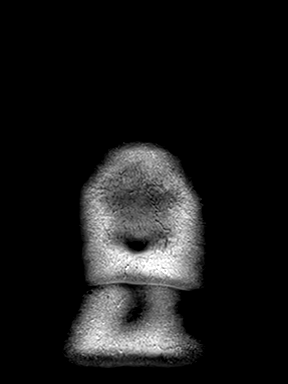

[Series 20: T1 post-contrast · sagittal · 5.0mm · 0.62mm/px · 1 of 25 slices shown (3 of 3)]
[im 1/25]
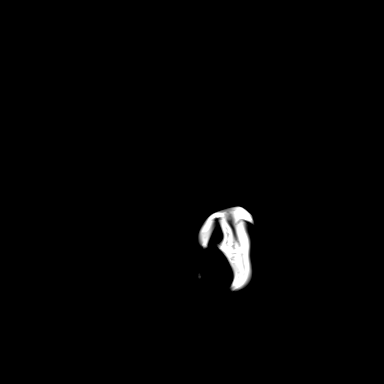

[48 of 48 positions shown; findings below may reference images not displayed]

FINDINGS: Brain: No acute infarction, hemorrhage, hydrocephalus or extra-axial
collection.

No interval change of the 1 cm left frontal meningioma.

Interval progression of parenchymal volume loss, particularly in the
bilateral frontal lobes and right temporal lobe. Minimal patchy T2
hyperintensity of the periventricular white matter. No focus of
abnormal contrast enhancement within the brain parenchyma.

Vascular: Normal flow voids.

Skull and upper cervical spine: Normal marrow signal.

Sinuses/Orbits: Mucosal thickening throughout the paranasal sinuses,
more pronounced in the bilateral ethmoid cells. The orbits are
maintained.

Other: None.
IMPRESSION: 1. Findings concerning for frontotemporal degeneration with mild
interval progression of volume loss since prior MRI, more noticeable
in the right temporal lobe.
2. Inflammatory paranasal sinus disease. Progressing compared to
prior MRI.

## 2023-09-04 ENCOUNTER — Ambulatory Visit: Admitting: Physician Assistant

## 2023-09-05 ENCOUNTER — Ambulatory Visit: Admitting: Physician Assistant

## 2024-02-11 ENCOUNTER — Emergency Department

## 2024-02-11 ENCOUNTER — Observation Stay
Admission: EM | Admit: 2024-02-11 | Discharge: 2024-02-12 | Disposition: A | Discharge status: Home Health | Attending: Internal Medicine | Admitting: Internal Medicine

## 2024-02-11 ENCOUNTER — Other Ambulatory Visit: Payer: Self-pay

## 2024-02-11 DIAGNOSIS — N2889 Other specified disorders of kidney and ureter: Secondary | ICD-10-CM | POA: Insufficient documentation

## 2024-02-11 DIAGNOSIS — R55 Syncope and collapse: Principal | ICD-10-CM | POA: Insufficient documentation

## 2024-02-11 DIAGNOSIS — G4733 Obstructive sleep apnea (adult) (pediatric): Secondary | ICD-10-CM | POA: Insufficient documentation

## 2024-02-11 DIAGNOSIS — G3109 Other frontotemporal dementia: Secondary | ICD-10-CM | POA: Insufficient documentation

## 2024-02-11 DIAGNOSIS — E782 Mixed hyperlipidemia: Secondary | ICD-10-CM | POA: Diagnosis present

## 2024-02-11 DIAGNOSIS — Z79899 Other long term (current) drug therapy: Secondary | ICD-10-CM | POA: Insufficient documentation

## 2024-02-11 DIAGNOSIS — R197 Diarrhea, unspecified: Secondary | ICD-10-CM | POA: Diagnosis present

## 2024-02-11 DIAGNOSIS — N179 Acute kidney failure, unspecified: Secondary | ICD-10-CM | POA: Insufficient documentation

## 2024-02-11 DIAGNOSIS — R195 Other fecal abnormalities: Secondary | ICD-10-CM

## 2024-02-11 DIAGNOSIS — N289 Disorder of kidney and ureter, unspecified: Principal | ICD-10-CM

## 2024-02-11 DIAGNOSIS — F32A Depression, unspecified: Secondary | ICD-10-CM | POA: Insufficient documentation

## 2024-02-11 DIAGNOSIS — F0280 Dementia in other diseases classified elsewhere without behavioral disturbance: Secondary | ICD-10-CM | POA: Diagnosis present

## 2024-02-11 DIAGNOSIS — I10 Essential (primary) hypertension: Secondary | ICD-10-CM | POA: Insufficient documentation

## 2024-02-11 DIAGNOSIS — G319 Degenerative disease of nervous system, unspecified: Secondary | ICD-10-CM | POA: Insufficient documentation

## 2024-02-11 DIAGNOSIS — R001 Bradycardia, unspecified: Secondary | ICD-10-CM | POA: Insufficient documentation

## 2024-02-11 DIAGNOSIS — E785 Hyperlipidemia, unspecified: Secondary | ICD-10-CM | POA: Insufficient documentation

## 2024-02-11 DIAGNOSIS — G3101 Pick's disease: Secondary | ICD-10-CM | POA: Insufficient documentation

## 2024-02-11 DIAGNOSIS — I959 Hypotension, unspecified: Secondary | ICD-10-CM | POA: Insufficient documentation

## 2024-02-11 LAB — CBC WITH DIFFERENTIAL/PLATELET
Abs Immature Granulocytes: 0.05 10*3/uL (ref 0.00–0.07)
Basophils Absolute: 0.1 10*3/uL (ref 0.0–0.1)
Basophils Relative: 1 %
Eosinophils Absolute: 0.5 10*3/uL (ref 0.0–0.5)
Eosinophils Relative: 5 %
HCT: 41.8 % (ref 39.0–52.0)
Hemoglobin: 13.6 g/dL (ref 13.0–17.0)
Immature Granulocytes: 1 %
Lymphocytes Relative: 16 %
Lymphs Abs: 1.7 10*3/uL (ref 0.7–4.0)
MCH: 28.9 pg (ref 26.0–34.0)
MCHC: 32.5 g/dL (ref 30.0–36.0)
MCV: 88.7 fL (ref 80.0–100.0)
Monocytes Absolute: 0.7 10*3/uL (ref 0.1–1.0)
Monocytes Relative: 7 %
Neutro Abs: 7.4 10*3/uL (ref 1.7–7.7)
Neutrophils Relative %: 70 %
Platelets: 266 10*3/uL (ref 150–400)
RBC: 4.71 MIL/uL (ref 4.22–5.81)
RDW: 12.3 % (ref 11.5–15.5)
WBC: 10.4 10*3/uL (ref 4.0–10.5)
nRBC: 0 % (ref 0.0–0.2)

## 2024-02-11 LAB — GLUCOSE, CAPILLARY: Glucose-Capillary: 84 mg/dL (ref 70–99)

## 2024-02-11 LAB — URINALYSIS, W/ REFLEX TO CULTURE (INFECTION SUSPECTED)
Bilirubin Urine: NEGATIVE
Glucose, UA: NEGATIVE mg/dL
Hgb urine dipstick: NEGATIVE
Ketones, ur: NEGATIVE mg/dL
Leukocytes,Ua: NEGATIVE
Nitrite: NEGATIVE
Protein, ur: NEGATIVE mg/dL
RBC / HPF: 0 RBC/hpf (ref 0–5)
Specific Gravity, Urine: 1.023 (ref 1.005–1.030)
Squamous Epithelial / HPF: 0 /HPF (ref 0–5)
pH: 5 (ref 5.0–8.0)

## 2024-02-11 LAB — LACTIC ACID, PLASMA
Lactic Acid, Venous: 1.2 mmol/L (ref 0.5–1.9)
Lactic Acid, Venous: 1 mmol/L (ref 0.5–1.9)

## 2024-02-11 LAB — COMPREHENSIVE METABOLIC PANEL WITH GFR
ALT: 14 U/L (ref 0–44)
AST: 15 U/L (ref 15–41)
Albumin: 4 g/dL (ref 3.5–5.0)
Alkaline Phosphatase: 76 U/L (ref 38–126)
Anion gap: 9 (ref 5–15)
BUN: 21 mg/dL — ABNORMAL HIGH (ref 6–20)
CO2: 26 mmol/L (ref 22–32)
Calcium: 8.9 mg/dL (ref 8.9–10.3)
Chloride: 105 mmol/L (ref 98–111)
Creatinine, Ser: 1.53 mg/dL — ABNORMAL HIGH (ref 0.61–1.24)
GFR, Estimated: 53 mL/min — ABNORMAL LOW
Glucose, Bld: 90 mg/dL (ref 70–99)
Potassium: 4.3 mmol/L (ref 3.5–5.1)
Sodium: 140 mmol/L (ref 135–145)
Total Bilirubin: 0.3 mg/dL (ref 0.0–1.2)
Total Protein: 6.6 g/dL (ref 6.5–8.1)

## 2024-02-11 LAB — TROPONIN T, HIGH SENSITIVITY: Troponin T High Sensitivity: 13 ng/L (ref 0–19)

## 2024-02-11 LAB — PROTIME-INR
INR: 1 (ref 0.8–1.2)
Prothrombin Time: 13.3 s (ref 11.4–15.2)

## 2024-02-11 MED ORDER — ENOXAPARIN SODIUM 40 MG/0.4ML IJ SOSY
40.0000 mg | PREFILLED_SYRINGE | INTRAMUSCULAR | Status: DC
  Administered 2024-02-11 – 2024-02-12 (×2): 40 mg via SUBCUTANEOUS
  Filled 2024-02-11 (×2): qty 0.4

## 2024-02-11 MED ORDER — MEMANTINE HCL 5 MG PO TABS
10.0000 mg | ORAL_TABLET | Freq: Two times a day (BID) | ORAL | Status: DC
  Administered 2024-02-11 – 2024-02-12 (×3): 10 mg via ORAL
  Filled 2024-02-11 (×3): qty 2

## 2024-02-11 MED ORDER — SODIUM CHLORIDE 0.9 % IV BOLUS
1000.0000 mL | Freq: Once | INTRAVENOUS | Status: AC
  Administered 2024-02-11: 1000 mL via INTRAVENOUS

## 2024-02-11 MED ORDER — ACETAMINOPHEN 325 MG PO TABS: 650.0000 mg | ORAL_TABLET | Freq: Four times a day (QID) | ORAL | Status: DC | PRN

## 2024-02-11 MED ORDER — ROSUVASTATIN CALCIUM 20 MG PO TABS
20.0000 mg | ORAL_TABLET | Freq: Every day | ORAL | Status: DC
  Administered 2024-02-11: 20 mg via ORAL
  Filled 2024-02-11: qty 1

## 2024-02-11 MED ORDER — SODIUM CHLORIDE 0.9 % IV SOLN: INTRAVENOUS | Status: DC

## 2024-02-11 MED ORDER — ONDANSETRON HCL 4 MG PO TABS: 4.0000 mg | ORAL_TABLET | Freq: Four times a day (QID) | ORAL | Status: DC | PRN

## 2024-02-11 MED ORDER — NORTRIPTYLINE HCL 25 MG PO CAPS
25.0000 mg | ORAL_CAPSULE | Freq: Every day | ORAL | Status: DC
  Administered 2024-02-11: 25 mg via ORAL
  Filled 2024-02-11 (×2): qty 1

## 2024-02-11 MED ORDER — DONEPEZIL HCL 5 MG PO TABS
10.0000 mg | ORAL_TABLET | Freq: Every day | ORAL | Status: DC
  Administered 2024-02-11: 10 mg via ORAL
  Filled 2024-02-11: qty 2

## 2024-02-11 MED ORDER — VENLAFAXINE HCL ER 75 MG PO CP24
75.0000 mg | ORAL_CAPSULE | Freq: Every day | ORAL | Status: DC
  Administered 2024-02-11 – 2024-02-12 (×2): 75 mg via ORAL
  Filled 2024-02-11 (×2): qty 1

## 2024-02-11 MED ORDER — ONDANSETRON HCL 4 MG/2ML IJ SOLN: 4.0000 mg | Freq: Four times a day (QID) | INTRAMUSCULAR | Status: DC | PRN

## 2024-02-11 MED ORDER — ACETAMINOPHEN 650 MG RE SUPP: 650.0000 mg | Freq: Four times a day (QID) | RECTAL | Status: DC | PRN

## 2024-02-11 MED ORDER — IOHEXOL 350 MG/ML SOLN
100.0000 mL | Freq: Once | INTRAVENOUS | Status: AC | PRN
  Administered 2024-02-11: 100 mL via INTRAVENOUS

## 2024-02-11 NOTE — ED Triage Notes (Signed)
 Pt arrives via [facility] from [facility] with c/o diarrhea that has been ongoing for the past few hours. Staff reported to EMS that pt passed out while going to the bathroom.  hx dementia  97/62 96.4

## 2024-02-11 NOTE — ED Provider Notes (Signed)
 "  Chad Stone Note    Event Date/Time   First MD Initiated Contact with Patient 02/11/24 0117     (approximate)   History   Diarrhea  EM caveat mother and sister provide history.  Patient's severe dementia  HPI  Chad Stone is a 54 y.o. male     Reviewed external records indicate patient has a history of nephrolithiasis hyperlipidemia cerebral atrophy and frontotemporal dementia.  Reviewed neurology note from January 13 of last year.  Patient's mother and sister report that he was at the bathroom at [facility].  He has been having loose stool.  No report of bloody stool.  Otherwise he has been noted to be in normal health but apparently today having loose stools.  Not noted to be in any pain.  While in the bathroom he fainted.  They do not think he hit his head as he usually is accompanied by someone to the bathroom but they also are not sure.  He has been acting normally.  They report he does not speak much.  They really cannot tell if he is having pain in any particular point because he cannot express it.  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     Physical Exam   Triage Vital Signs: ED Triage Vitals  Encounter Vitals Group     BP 02/11/24 0019 (!) 85/66     Girls Systolic BP Percentile --      Girls Diastolic BP Percentile --      Boys Systolic BP Percentile --      Boys Diastolic BP  Percentile --      Pulse Rate 02/11/24 0019 60     Resp --      Temp 02/11/24 0021 98 F (36.7 C)     Temp Source 02/11/24 0021 Axillary     SpO2 02/11/24 0019 98 %     Weight --      Height --      Head Circumference --      Peak Flow --      Pain Score 02/11/24 0012 0     Pain Loc --      Pain Education --      Exclude from Growth Chart --     Most recent vital signs: Vitals:   02/11/24 0400 02/11/24 0500  BP: 105/79 116/74  Pulse: (!) 56 63  Resp: 16 18  Temp:    SpO2:  100%     General: Awake, no distress.  Normocephalic atraumatic does not appear in pain.  Moderate hypotension notable on multiple checks close blood pressure [place] 85/66 CV:   Good peripheral perfusion. Normal rate and heart tones. Resp:   Normal effort. Lung sounds clear bilateral. Speaking without distress. Abd:   No distention. Soft, non-tender to palpation in all quadrants. No rebound or guarding. Neuro:   No focal neuro deficits noted. Moves extremities well  without noted concern. Other:    Patient denies pain anywhere.  But his exam is not exactly reliable given his dementia.  Abdomen is soft nontender nondistended.  There is no overt diarrhea or bloody stool noted in his depends at this time.  No rashes noted.  His back appears normal his respirations clear.  Mucous membranes somewhat dry   ED Results / Procedures / Treatments   Labs (all labs ordered are listed, but only abnormal results are displayed) Labs Reviewed  COMPREHENSIVE METABOLIC PANEL WITH GFR - Abnormal; Notable for the following components:      Result Value   BUN 21 (*)    Creatinine, Ser 1.53 (*)    GFR, Estimated 53 (*)    All other components within normal limits  GASTROINTESTINAL PANEL BY PCR, STOOL (REPLACES STOOL CULTURE)  C DIFFICILE QUICK SCREEN W PCR REFLEX    CULTURE, BLOOD (SINGLE)  LACTIC ACID, PLASMA  LACTIC ACID, PLASMA  PROTIME-INR  CBC WITH DIFFERENTIAL/PLATELET  CBC WITH DIFFERENTIAL/PLATELET   OCCULT BLOOD X 1 CARD TO LAB, STOOL  URINALYSIS, W/ REFLEX TO CULTURE (INFECTION SUSPECTED)  TROPONIN T, HIGH SENSITIVITY     EKG  I independently reviewed the EKG at 4:30 AM heart rate 60 QRS 80 QTc 420.  Normal sinus no evidence ischemia   RADIOLOGY I independently reviewed images of CT head, generalized volume loss cerebral but no acute intracranial hemorrhage  CT Head Wo Contrast Result Date: 02/11/2024 EXAM: CT HEAD WITHOUT CONTRAST 02/11/2024 03:22:03 AM TECHNIQUE: CT of the head was performed without the administration of intravenous contrast. Automated exposure control, iterative reconstruction, and/or weight based adjustment of the mA/kV was utilized to reduce the radiation dose to as low as reasonably achievable. COMPARISON: Prior examination of 01/03/2020. CLINICAL HISTORY: Facial trauma, blunt. FINDINGS: BRAIN AND VENTRICLES: No acute hemorrhage. No evidence of acute infarct. Advanced asymmetrically frontotemporal cerebral atrophy, significantly progressed since prior examination of 01/03/2020. Ventricular size is moderately enlarged, commensurate with the degree of parenchymal atrophy and likely reflecting ex vacuo dilation. 11 mm calcified meningioma in anterior left frontal lobe. No extra-axial collection. No mass effect or midline shift. ORBITS: No acute abnormality. SINUSES: Mucosal thickening in ethmoid air cells and right maxillary sinus. SOFT TISSUES AND SKULL: No acute soft tissue abnormality. No skull fracture. IMPRESSION: 1. No acute intracranial abnormality. 2. Advanced asymmetrically frontotemporal cerebral atrophy, significantly progressed. 3. Moderate ventriculomegaly, commensurate with the degree of parenchymal atrophy and likely reflecting ex vacuo dilation. 4. 11 mm calcified meningioma in the anterior left frontal lobe. 5. Ethmoidal and right maxillary sinus disease Electronically signed by: Dorethia Molt MD 02/11/2024 03:46 AM EST RP Workstation: [id number]   CT  ABDOMEN PELVIS W CONTRAST Result Date: 02/11/2024 EXAM: CT ABDOMEN AND PELVIS WITH CONTRAST 02/11/2024 03:22:03 AM TECHNIQUE: CT of the abdomen and pelvis was performed with the administration of 100 mL of iohexol  (OMNIPAQUE ) 350 MG/ML injection. Multiplanar reformatted images are provided for review. Automated exposure control, iterative reconstruction, and/or weight-based adjustment of the mA/kV was utilized to reduce the radiation dose to as low as reasonably achievable. COMPARISON: None available. CLINICAL HISTORY: Diarrhea, syncopal episode, and loose stools. Patient with significant dementia and unreliable exam. FINDINGS: LOWER CHEST: Bibasilar atelectasis. LIVER: The liver is unremarkable. GALLBLADDER AND BILE DUCTS: Gallbladder is unremarkable. No biliary ductal dilatation. SPLEEN: No acute abnormality. PANCREAS: No acute abnormality. ADRENAL GLANDS: No acute abnormality. KIDNEYS, URETERS AND BLADDER: Mild bilateral renal cortical scarring. There is an exophytic 13 mm lesion arising from the interpolar  region of the right kidney which is indeterminate, measuring 37 Hounsfield units in mean density on portal venous phase imaging and 22 Hounsfield units in intensity on delayed phase imaging. This may represent a hyperdense renal cyst or cystic renal neoplasm and dedicated non-urgent renal mass protocol CT or MRI examination is recommended for further characterization. No stones in the kidneys or ureters. No hydronephrosis. No perinephric or periureteral stranding. Urinary bladder is unremarkable. GI AND BOWEL: Appendix normal. The stomach, small bowel, and large bowel are otherwise unremarkable. There is no bowel obstruction. PERITONEUM AND RETROPERITONEUM: No ascites. No free air. VASCULATURE: Aorta is normal in caliber. LYMPH NODES: No lymphadenopathy. REPRODUCTIVE ORGANS: No acute abnormality. BONES AND SOFT TISSUES: Remote cancer wedge compression deformity of L1 with approximately 40 - 50% loss of height  anteriorly. Osseous structures are age appropriate. No acute bone abnormality. No lytic or blastic bone lesion. No focal soft tissue abnormality. IMPRESSION: 1. No acute abdominopelvic abnormality identified. 2. Indeterminate 13 mm exophytic right interpolar renal lesion, which could represent a hyperdense cyst or a cystic renal neoplasm; recommend nonurgent renal mass protocol MRI (preferred) or CT without and with IV contrast for further characterization. 3. Remote L1 wedge compression deformity with approximately 40-50% anterior height loss. Electronically signed by: Dorethia Molt MD 02/11/2024 03:41 AM EST RP Workstation: [id number]        PROCEDURES:  Critical Care performed: No  Procedures   MEDICATIONS ORDERED IN ED: Medications  sodium chloride  0.9 % bolus 1,000 mL (has no administration in time range)  sodium chloride  0.9 % bolus 1,000 mL (0 mLs Intravenous Stopped 02/11/24 0419)  iohexol  (OMNIPAQUE ) 350 MG/ML injection 100 mL (100 mLs Intravenous Contrast Given 02/11/24 0304)     IMPRESSION / MDM / ASSESSMENT AND PLAN / ED COURSE  I reviewed the triage vital signs and the nursing notes.                              Based on presentation, the differential diagnosis includes, but is not limited to key considerations: syncope DIFFERENTIAL DIAGNOSIS CONSIDERATIONS: High-acuity etiologies considered and prioritized for rule-out: - Cardiac Arrhythmia (VT / VF / SVT / AV Block) - Structural Heart Disease (HOCM / Critical AS) - Pulmonary Embolism - Acute Coronary Syndrome - Aortic Dissection - Subarachnoid Hemorrhage  Given no obvious pain I think the likelihood of ACS dissection PE quite unlikely he has not hypoxic or tachycardic making PE highly unlikely.  He also has associated loose stool but no obvious abdominal pain, suspect likely dehydration with dry mucous membranes syncopal episode associated.  Will proceed to further evaluate and obtain stool if possible for blood  testing, infectious testing.  Will proceed with CT abdomen pelvis to exclude intra-abdominal given the unreliability of exam.  Obtain CT head to evaluate for signs of injury though seems unlikely by exam but again unreliable historian  Plan to hydrate monitor closely.  No active nausea or vomiting noted at this time.    Patient's presentation is most consistent with acute complicated illness / injury requiring diagnostic workup.    The patient is on the cardiac monitor to evaluate for evidence of arrhythmia and/or significant heart rate changes.  Clinical Course as of 02/11/24 0552  Wed Feb 11, 2024  9541 Patient fails to demonstrate low risk syncope criteria.  His initial blood pressure was less than 90.  He also has cognitive impairment, and after discussing with his family at the  bedside we will proceed with plan for admission and further care and treatment for syncope.  He seems to be doing well now no further diarrhea updated family workup thus far reassuring.  Blood pressure is improving.  Awaiting urinalysis [MQ]  [phone] Patient accepted to hospitalist service for ongoing workup and further syncope evaluation/observation by Dr. Lawence [MQ]    Clinical Course User Index [MQ] Dicky Anes, MD     FINAL CLINICAL IMPRESSION(S) / ED DIAGNOSES   Final diagnoses:  Renal lesion  Syncope and collapse  Severe hypotension  Loose stools     Rx / DC Orders   ED Discharge Orders     None        Note:  This document was prepared using Dragon voice recognition software and may include unintentional dictation errors.   Dicky Anes, MD 02/11/24 [phone]  "

## 2024-02-11 NOTE — Progress Notes (Signed)
 Mobility Specialist Progress Note:    02/11/24 1429  Mobility  Activity Refused and notified nurse if applicable   Pt politely refused mobility at this time, is comfortable in chair. Will re-attempt as appropriate, all needs met.  Sherrilee Ditty Mobility Specialist Please contact via Special Educational Needs Teacher or  Rehab office at [phone]

## 2024-02-11 NOTE — Evaluation (Signed)
 Occupational Therapy Evaluation Patient Details Name: Chad Stone MRN: [id number] DOB: 09-12-1970 Today's Date: 02/11/2024   History of Present Illness   Chad Stone is a pleasant 54 y.o. male with medical history significant for cerebral atrophy, dementia, HTN, HLD, glaucoma, OSA on CPAP who was brought in for a syncopal episode while at the bathroom.  Patient had episodes of loose stools and diarrhea.     Clinical Impressions Patient was seen for OT evaluation this date. Prior to hospital admission, patient was resident at [facility] house, he has significant cognitive deficits related to dementia. Patient in bed upon OT arrival, disoriented x 4, only able to follow simple commands ~10% of the time. Performed bed mobility, sit<>stand and in room mobility with hand held assist. Patient presents with deficits in overall activity tolerance and cognitive deficits, affecting safe and optimal ADL completion. Patient is currently requiring CGA and max encouragement/coaxing to perform ADLs.  Paient would benefit from skilled OT services to address noted impairments and functional limitations (see below for any additional details) in order to maximize safety and independence while minimizing future risk of falls, injury, and readmission. Anticipate the need for follow up OT services upon acute hospital DC.      If plan is discharge home, recommend the following:   A little help with walking and/or transfers;A little help with bathing/dressing/bathroom;Assistance with feeding;Assistance with cooking/housework;Direct supervision/assist for medications management;Direct supervision/assist for financial management;Assist for transportation;Help with stairs or ramp for entrance;Supervision due to cognitive status     Functional Status Assessment   Patient has had a recent decline in their functional status and demonstrates the ability to make significant improvements in function in a  reasonable and predictable amount of time.     Equipment Recommendations   None recommended by OT     Recommendations for Other Services         Precautions/Restrictions   Precautions Precautions: Fall Recall of Precautions/Restrictions: Impaired Restrictions Weight Bearing Restrictions Per Provider Order: No     Mobility Bed Mobility Overal bed mobility: Needs Assistance Bed Mobility: Supine to Sit     Supine to sit: Supervision          Transfers Overall transfer level: Needs assistance Equipment used: 1 person hand held assist Transfers: Sit to/from Stand Sit to Stand: Contact guard assist                  Balance Overall balance assessment: Mild deficits observed, not formally tested                                         ADL either performed or assessed with clinical judgement   ADL Overall ADL's : At baseline                                       General ADL Comments: requires A with all tasks due to cognitive deficits     Vision         Perception         Praxis         Pertinent Vitals/Pain Pain Assessment Pain Assessment: Faces Faces Pain Scale: No hurt     Extremity/Trunk Assessment Upper Extremity Assessment Upper Extremity Assessment: Generalized weakness   Lower Extremity Assessment Lower Extremity Assessment: Generalized weakness  Cervical / Trunk Assessment Cervical / Trunk Assessment: Normal   Communication Communication Communication: Impaired Factors Affecting Communication: Difficulty expressing self   Cognition Arousal: Alert Behavior During Therapy: Flat affect Cognition: Cognition impaired, History of cognitive impairments   Orientation impairments: Person, Time, Place, Situation Awareness: Intellectual awareness impaired Memory impairment (select all impairments): Short-term memory, Working civil service fast streamer, Non-declarative long-term memory, Geneticist, Molecular long-term  memory Attention impairment (select first level of impairment): Focused attention Executive functioning impairment (select all impairments): Initiation, Organization, Sequencing, Reasoning, Problem solving                   Following commands: Impaired Following commands impaired: Follows one step commands inconsistently, Follows one step commands with increased time     Cueing  General Comments   Cueing Techniques: Verbal cues;Gestural cues;Tactile cues;Visual cues      Exercises     Shoulder Instructions      Home Living Family/patient expects to be discharged to:: Assisted living                                 Additional Comments: patient is resident at [facility] house      Prior Functioning/Environment Prior Level of Function : Needs assist             Mobility Comments: patient ambulates without AD with CGA ADLs Comments: requires A due to significant cognitive deficits    OT Problem List: Decreased strength;Decreased activity tolerance;Decreased cognition;Decreased safety awareness   OT Treatment/Interventions: Self-care/ADL training;Therapeutic exercise;Therapeutic activities;Patient/family education;Balance training      OT Goals(Current goals can be found in the care plan section)   Acute Rehab OT Goals OT Goal Formulation: Patient unable to participate in goal setting Time For Goal Achievement: 02/25/24 Potential to Achieve Goals: Fair ADL Goals Pt Will Perform Grooming: with min assist Pt Will Perform Lower Body Dressing: with min assist;sit to/from stand Pt Will Transfer to Toilet: with contact guard assist;ambulating;regular height toilet   OT Frequency:  Min 1X/week    Co-evaluation              AM-PAC OT 6 Clicks Daily Activity     Outcome Measure Help from another person eating meals?: A Little Help from another person taking care of personal grooming?: A Little Help from another person toileting, which  includes using toliet, bedpan, or urinal?: A Little Help from another person bathing (including washing, rinsing, drying)?: A Little Help from another person to put on and taking off regular upper body clothing?: A Little Help from another person to put on and taking off regular lower body clothing?: A Little 6 Click Score: 18   End of Session Nurse Communication: Mobility status  Activity Tolerance: Patient tolerated treatment well Patient left: in chair;with call bell/phone within reach;with chair alarm set  OT Visit Diagnosis: Unsteadiness on feet (R26.81);Muscle weakness (generalized) (M62.81)                Time: 9066-9045 OT Time Calculation (min): 21 min Charges:  OT General Charges $OT Visit: 1 Visit OT Evaluation $OT Eval Low Complexity: 1 Low  Rogers Clause, OT/L MSOT, 02/11/2024

## 2024-02-11 NOTE — TOC Initial Note (Addendum)
 Transition of Care [facility]) - Initial/Assessment Note    Patient Details  Name: Chad Stone MRN: [id number] Date of Birth: 1970-04-01  [facility] of [facility] CM/SW Contact:    Grayce JAYSON Perfect, RN Phone Number: 02/11/2024, 12:11 PM  Clinical Narrative:    RNCM met with patient in his room.  He has diagnosis of dementia, able to state his name. He is a resident at [facility] and will return there once medically stable.  He is able to say yes when I ask if he was ok with returning to ALF.  Unable to ask other questions due to level of dementia.  RNCM will continue to follow until discharge back to [facility].   RNCM spoke to sister- she is POA and in agreement with plan to return to [facility] once discharged from hospital.  Patient's mother will transport him.      RNCM spoke with [facility]- they have OT/PT services in facility and need order for these services before discharge.        Expected Discharge Plan: Assisted Living Barriers to Discharge: Continued Medical Work up   Patient Goals and CMS Choice Patient states their goals for this hospitalization and ongoing recovery are:: unable to state due to level of dementia          Expected Discharge Plan and Services   Discharge Planning Services: CM Consult   Living arrangements for the past 2 months: Assisted Living Facility                                      Prior Living Arrangements/Services Living arrangements for the past 2 months: Assisted Living Facility Lives with:: Facility Resident Patient language and need for interpreter reviewed:: Yes Do you feel safe going back to the place where you live?: Yes      Need for Family Participation in Patient Care: No (Comment) Care giver support system in place?: Yes (comment) (sister)   Criminal Activity/Legal Involvement Pertinent to Current Situation/Hospitalization: No - Comment as needed  Activities of Daily Living   ADL  Screening (condition at time of admission) Independently performs ADLs?: No Does the patient have a NEW difficulty with bathing/dressing/toileting/self-feeding that is expected to last >3 days?: Yes (Initiates electronic notice to provider for possible OT consult) Does the patient have a NEW difficulty with getting in/out of bed, walking, or climbing stairs that is expected to last >3 days?: No Does the patient have a NEW difficulty with communication that is expected to last >3 days?: No Is the patient deaf or have difficulty hearing?: No  Permission Sought/Granted                  Emotional Assessment Appearance:: Appears older than stated age Attitude/Demeanor/Rapport: Inconsistent, Engaged Affect (typically observed): Calm, Flat (diagnosis of dementia) Orientation: : Oriented to Self Alcohol / Substance Use: Never Used Psych Involvement: No (comment)  Admission diagnosis:  Syncope and collapse [R55] Syncope [R55] Severe hypotension [I95.9] Loose stools [R19.5] Renal lesion [N28.9] Patient Active Problem List   Diagnosis Date Noted   Syncope, vasovagal 02/11/2024   Diarrhea 02/11/2024   Frontotemporal dementia without behavioral disturbance 06/07/2021   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 [place]) [place] [address] Phone: [phone] Fax: [phone]     Social Drivers of Health (SDOH) Social History: SDOH Screenings   Food Insecurity: No Food Insecurity (11/29/2022)   Received from [facility]  Housing: Low Risk  (11/29/2022)   Received [facility]  Transportation Needs: No Transportation Needs  (11/29/2022)   Received from [facility]  Utilities: Not At Risk (11/29/2022)   Received from [facility]  Financial Resource Strain: Low Risk  (11/29/2022)   Received from [facility]  Tobacco Use: Low Risk  (06/02/2023)   Received from [facility]   SDOH Interventions:     Readmission Risk Interventions     No data to display

## 2024-02-11 NOTE — Plan of Care (Signed)
   Problem: Health Behavior/Discharge Planning: Goal: Ability to manage health-related needs will improve Outcome: Progressing

## 2024-02-11 NOTE — H&P (Signed)
 "  History and Physical    Chad Stone FMW:[id number] DOB: May 05, 1970 DOA: 02/11/2024  DOS: the patient was seen and examined on 02/11/2024  PCP: Auston Reyes BIRCH, MD   Patient coming from: [facility] [facility]  I have personally briefly reviewed patient's old medical records in [facility]  Chief Complaint: Diarrhea, passed out in the bathroom  HPI: Chad Stone is a pleasant 54 y.o. male with medical history significant for cerebral atrophy, dementia, HTN, HLD, glaucoma, OSA on CPAP who was brought in for a syncopal episode while at the bathroom.  Patient had episodes of loose stools and diarrhea. EMS vitals were 97/62, 96.4.  Patient is pleasantly demented and not able to provide meaningful history.  To me he denied any symptoms.  ED Course: Upon arrival to the ED, patient is found to be hypotensive at 85/66 in the ED, bradycardic around 56 to 60 bpm, BUN 21 creatinine 1.53, higher than his baseline around 1.1.  CT head and CT abdomen and pelvis were unremarkable for new findings. Hospitalist service was consulted for evaluation for admission for possible syncopal episode likely due to dehydration and hypotension. Review of Systems:  ROS  All other systems negative except as noted in the HPI.  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 He has never used smokeless tobacco. He reports that he does not currently use alcohol. He reports that he does not use  drugs.  Allergies[1]  Family History  Problem Relation Age of Onset   Arrhythmia Father        cardiac ablation   Alzheimer's disease Maternal Grandmother     Prior to Admission medications  Medication Sig Start Date End Date Taking? Authorizing Provider  bisoprolol (ZEBETA) 5 MG tablet Take 1 tablet by mouth daily at 6 PM. 06/03/21  Yes [provider]  busPIRone (BUSPAR) 10 MG tablet Take 10 mg by mouth 2 (two) times daily.   Yes [provider]  donepezil  (ARICEPT ) 10 MG tablet Take 10 mg by mouth at bedtime. 01/20/24  Yes [provider]  memantine  (NAMENDA ) 10 MG tablet Take one tab twice a day Patient taking differently: Take 10 mg by mouth 2 (two) times daily. 01/27/23  Yes Wertman, Sara E, PA-C  nortriptyline  (PAMELOR ) 25 MG capsule Take 25 mg by mouth at bedtime.   Yes [provider]  olmesartan (BENICAR) 40 MG tablet Take 40 mg by mouth daily.   Yes [provider]  oxybutynin (DITROPAN-XL) 10 MG 24 hr tablet Take 10 mg by mouth daily.   Yes [provider]  rosuvastatin  (CRESTOR ) 20 MG tablet Take 20 mg by mouth at bedtime.   Yes [provider]  venlafaxine  XR (EFFEXOR -XR) 75 MG 24 hr capsule Take 75 mg by mouth daily with breakfast.  11/10/23  Yes [provider]    Physical Exam: Vitals:   02/11/24 0400 02/11/24 0500 02/11/24 0630 02/11/24 0828  BP: 105/79 116/74  113/84 109/78  Pulse: (!) 56 63 66 72  Resp: 16 18 18 16   Temp:   97.7 F (36.5 C) 97.7 F (36.5 C)  TempSrc:   Oral Oral  SpO2:  100% 100% 96%  Weight:   96.8 kg   Height:   6' (1.829 m)     Physical Exam   Constitutional: Alert, awake, calm, comfortable HEENT: Neck supple Respiratory: Clear to auscultation B/L, no wheezing, no rales.  Cardiovascular: Regular rate and rhythm, no murmurs / rubs / gallops. No extremity edema. 2+ pedal pulses. No carotid bruits.  Abdomen: Soft, no tenderness, Bowel sounds positive.   Musculoskeletal: no clubbing / cyanosis. Good ROM, no contractures. Normal muscle tone.  Skin: no rashes, lesions, ulcers. Neurologic: CN 2-12 grossly intact. Sensation intact, No focal deficit identified Psychiatric: Alert awake and demented  Labs on Admission: I have personally reviewed following labs and imaging studies  CBC: Recent Labs  Lab 02/11/24 0110  WBC 10.4  NEUTROABS 7.4  HGB 13.6  HCT 41.8  MCV 88.7  PLT 266   Basic Metabolic Panel: Recent Labs  Lab 02/11/24 0110  NA 140  K 4.3  CL 105  CO2 26  GLUCOSE 90  BUN 21*  CREATININE 1.53*  CALCIUM  8.9   GFR: Estimated Creatinine Clearance: 65.8 mL/min (A) (by C-G formula based on SCr of 1.53 mg/dL (H)). Liver Function Tests: Recent Labs  Lab 02/11/24 0110  AST 15  ALT 14  ALKPHOS 76  BILITOT 0.3  PROT 6.6  ALBUMIN 4.0   No results for input(s): LIPASE, AMYLASE in the last 168 hours. No results for input(s): AMMONIA in the last 168 hours. Coagulation Profile: Recent Labs  Lab 02/11/24 0241  INR 1.0   Cardiac Enzymes: No results for input(s): CKTOTAL, CKMB, CKMBINDEX, TROPONINI, TROPONINIHS in the last 168 hours. BNP (last 3 results) No results for input(s): BNP in the last 8760 hours. HbA1C: No results for input(s): HGBA1C in the last 72 hours. CBG: No results for input(s): GLUCAP in the last 168 hours. Lipid Profile: No results for input(s): CHOL, HDL, LDLCALC, TRIG, CHOLHDL, LDLDIRECT in the last 72 hours. Thyroid  Function Tests: No results for input(s): TSH, T4TOTAL, FREET4, T3FREE, THYROIDAB in the last 72 hours. Anemia Panel: No results for input(s): VITAMINB12, FOLATE, FERRITIN, TIBC, IRON, RETICCTPCT in the last 72 hours. Urine analysis:    Component Value Date/Time   COLORURINE YELLOW (A) 11/18/2018 1728   APPEARANCEUR CLEAR (A) 11/18/2018 1728   LABSPEC 1.021 11/18/2018 1728   PHURINE 5.0 11/18/2018 1728   GLUCOSEU  NEGATIVE 11/18/2018 1728   HGBUR NEGATIVE 11/18/2018 1728   BILIRUBINUR NEGATIVE 11/18/2018 1728   KETONESUR NEGATIVE 11/18/2018 1728   PROTEINUR NEGATIVE 11/18/2018 1728   NITRITE NEGATIVE 11/18/2018 1728   LEUKOCYTESUR NEGATIVE 11/18/2018 1728    Radiological Exams on Admission: I have personally reviewed images CT Head Wo Contrast Result Date: 02/11/2024 EXAM: CT HEAD WITHOUT CONTRAST 02/11/2024 03:22:03 AM TECHNIQUE: CT of the head was performed without the administration of intravenous contrast. Automated exposure control, iterative reconstruction, and/or weight based adjustment of the mA/kV was utilized to reduce the radiation dose to as low as reasonably achievable. COMPARISON: Prior examination of 01/03/2020. CLINICAL HISTORY: Facial trauma, blunt. FINDINGS: BRAIN AND VENTRICLES: No acute hemorrhage. No evidence of acute infarct. Advanced asymmetrically frontotemporal cerebral atrophy, significantly progressed since prior examination of 01/03/2020. Ventricular size is moderately enlarged, commensurate with the degree of parenchymal atrophy and likely reflecting ex vacuo dilation. 11 mm calcified meningioma  in anterior left frontal lobe. No extra-axial collection. No mass effect or midline shift. ORBITS: No acute abnormality. SINUSES: Mucosal thickening in ethmoid air cells and right maxillary sinus. SOFT TISSUES AND SKULL: No acute soft tissue abnormality. No skull fracture. IMPRESSION: 1. No acute intracranial abnormality. 2. Advanced asymmetrically frontotemporal cerebral atrophy, significantly progressed. 3. Moderate ventriculomegaly, commensurate with the degree of parenchymal atrophy and likely reflecting ex vacuo dilation. 4. 11 mm calcified meningioma in the anterior left frontal lobe. 5. Ethmoidal and right maxillary sinus disease Electronically signed by: Dorethia Molt MD 02/11/2024 03:46 AM EST RP Workstation: [id number]   CT ABDOMEN PELVIS W CONTRAST Result Date: 02/11/2024 EXAM: CT  ABDOMEN AND PELVIS WITH CONTRAST 02/11/2024 03:22:03 AM TECHNIQUE: CT of the abdomen and pelvis was performed with the administration of 100 mL of iohexol  (OMNIPAQUE ) 350 MG/ML injection. Multiplanar reformatted images are provided for review. Automated exposure control, iterative reconstruction, and/or weight-based adjustment of the mA/kV was utilized to reduce the radiation dose to as low as reasonably achievable. COMPARISON: None available. CLINICAL HISTORY: Diarrhea, syncopal episode, and loose stools. Patient with significant dementia and unreliable exam. FINDINGS: LOWER CHEST: Bibasilar atelectasis. LIVER: The liver is unremarkable. GALLBLADDER AND BILE DUCTS: Gallbladder is unremarkable. No biliary ductal dilatation. SPLEEN: No acute abnormality. PANCREAS: No acute abnormality. ADRENAL GLANDS: No acute abnormality. KIDNEYS, URETERS AND BLADDER: Mild bilateral renal cortical scarring. There is an exophytic 13 mm lesion arising from the interpolar region of the right kidney which is indeterminate, measuring 37 Hounsfield units in mean density on portal venous phase imaging and 22 Hounsfield units in intensity on delayed phase imaging. This may represent a hyperdense renal cyst or cystic renal neoplasm and dedicated non-urgent renal mass protocol CT or MRI examination is recommended for further characterization. No stones in the kidneys or ureters. No hydronephrosis. No perinephric or periureteral stranding. Urinary bladder is unremarkable. GI AND BOWEL: Appendix normal. The stomach, small bowel, and large bowel are otherwise unremarkable. There is no bowel obstruction. PERITONEUM AND RETROPERITONEUM: No ascites. No free air. VASCULATURE: Aorta is normal in caliber. LYMPH NODES: No lymphadenopathy. REPRODUCTIVE ORGANS: No acute abnormality. BONES AND SOFT TISSUES: Remote cancer wedge compression deformity of L1 with approximately 40 - 50% loss of height anteriorly. Osseous structures are age appropriate. No  acute bone abnormality. No lytic or blastic bone lesion. No focal soft tissue abnormality. IMPRESSION: 1. No acute abdominopelvic abnormality identified. 2. Indeterminate 13 mm exophytic right interpolar renal lesion, which could represent a hyperdense cyst or a cystic renal neoplasm; recommend nonurgent renal mass protocol MRI (preferred) or CT without and with IV contrast for further characterization. 3. Remote L1 wedge compression deformity with approximately 40-50% anterior height loss. Electronically signed by: Dorethia Molt MD 02/11/2024 03:41 AM EST RP Workstation: [id number]    EKG: My personal interpretation of EKG shows: Normal sinus rhythm at 59 bpm    Assessment/Plan Principal Problem:   Syncope, vasovagal Active Problems:   Benign essential hypertension   Cerebral atrophy   Hyperlipidemia, mixed   OSA (obstructive sleep apnea)   Frontotemporal dementia without behavioral disturbance   Primary progressive aphasia   Diarrhea    Assessment and Plan: 54 year old male with multiple medical problems including but not limited to dementia, HTN, HLD, cerebral atrophy, OSA who was brought in for a syncopal episode after multiple episodes of diarrhea.  1.  Syncopal episode at the nursing home while going to the bathroom - He was hypotensive - He appears to be dehydrated - His CT scan  of the brain and CT abdomen and pelvis are unremarkable - He will be placed in observation. - He will be given IV fluid, antinausea medication and monitor kidney function. - Send C. difficile and stool studies.  2.  AKI - Gentle hydration - Continue to monitor kidney function.  3.  Dementia - Continue supportive care - Continue his home medication donepezil  and memantine   4.  HTN/HLD - Will hold off blood pressure medication due to hypotension. - Continue rosuvastatin  at home dose. - Continue to monitor blood pressure  5.  OSA on CPAP - Continue CPAP during the night  6.  Depression -  Continue venlafaxine  and nortriptyline  at home dose     DVT prophylaxis: Lovenox  Code Status: Full Code Family Communication: None  Disposition Plan: Back to nursing home Consults called: None Admission status: Observation, Telemetry bed   Nena Rebel, MD [facility] 02/11/2024, 8:57 AM        [1] No Known Allergies  "

## 2024-02-12 ENCOUNTER — Other Ambulatory Visit: Payer: Self-pay

## 2024-02-12 DIAGNOSIS — I959 Hypotension, unspecified: Secondary | ICD-10-CM

## 2024-02-12 DIAGNOSIS — I9589 Other hypotension: Secondary | ICD-10-CM

## 2024-02-12 DIAGNOSIS — E785 Hyperlipidemia, unspecified: Secondary | ICD-10-CM | POA: Diagnosis not present

## 2024-02-12 DIAGNOSIS — N179 Acute kidney failure, unspecified: Secondary | ICD-10-CM

## 2024-02-12 DIAGNOSIS — R197 Diarrhea, unspecified: Secondary | ICD-10-CM

## 2024-02-12 DIAGNOSIS — R55 Syncope and collapse: Secondary | ICD-10-CM | POA: Diagnosis not present

## 2024-02-12 DIAGNOSIS — R001 Bradycardia, unspecified: Secondary | ICD-10-CM | POA: Diagnosis not present

## 2024-02-12 DIAGNOSIS — F0280 Dementia in other diseases classified elsewhere without behavioral disturbance: Secondary | ICD-10-CM

## 2024-02-12 DIAGNOSIS — G3109 Other frontotemporal dementia: Secondary | ICD-10-CM

## 2024-02-12 LAB — COMPREHENSIVE METABOLIC PANEL WITH GFR
ALT: 13 U/L (ref 0–44)
AST: 17 U/L (ref 15–41)
Albumin: 4.3 g/dL (ref 3.5–5.0)
Alkaline Phosphatase: 80 U/L (ref 38–126)
Anion gap: 13 (ref 5–15)
BUN: 14 mg/dL (ref 6–20)
CO2: 24 mmol/L (ref 22–32)
Calcium: 9.2 mg/dL (ref 8.9–10.3)
Chloride: 107 mmol/L (ref 98–111)
Creatinine, Ser: 1.16 mg/dL (ref 0.61–1.24)
GFR, Estimated: >60 mL/min
Glucose, Bld: 79 mg/dL (ref 70–99)
Potassium: 4.3 mmol/L (ref 3.5–5.1)
Sodium: 144 mmol/L (ref 135–145)
Total Bilirubin: 0.5 mg/dL (ref 0.0–1.2)
Total Protein: 7 g/dL (ref 6.5–8.1)

## 2024-02-12 LAB — GASTROINTESTINAL PANEL BY PCR, STOOL (REPLACES STOOL CULTURE)

## 2024-02-12 LAB — CBC
HCT: 45 % (ref 39.0–52.0)
Hemoglobin: 14.2 g/dL (ref 13.0–17.0)
MCH: 29.5 pg (ref 26.0–34.0)
MCHC: 31.6 g/dL (ref 30.0–36.0)
MCV: 93.4 fL (ref 80.0–100.0)
Platelets: 262 10*3/uL (ref 150–400)
RBC: 4.82 MIL/uL (ref 4.22–5.81)
RDW: 12.5 % (ref 11.5–15.5)
WBC: 5.1 10*3/uL (ref 4.0–10.5)
nRBC: 0 % (ref 0.0–0.2)

## 2024-02-12 LAB — GLUCOSE, CAPILLARY: Glucose-Capillary: 70 mg/dL (ref 70–99)

## 2024-02-12 MED ORDER — LOPERAMIDE HCL 2 MG PO CAPS
2.0000 mg | ORAL_CAPSULE | Freq: Three times a day (TID) | ORAL | 0 refills | Status: AC | PRN
  Filled 2024-02-12: qty 8, 3d supply, fill #0

## 2024-02-12 NOTE — Assessment & Plan Note (Signed)
 Creatinine improved from 1.53 down to 1.16.  Holding Benicar

## 2024-02-12 NOTE — Assessment & Plan Note (Signed)
 On Crestor 

## 2024-02-12 NOTE — NC FL2 (Signed)
 " [place]  MEDICAID FL2 LEVEL OF CARE FORM     IDENTIFICATION  Patient Name: Chad Stone Birthdate: Oct 04, 1970 Sex: male Admission Date (Current Location): 02/11/2024  [facility] [place] Number:  Chiropodist and Address:  [facility], [address], [place], [place] [address]      Provider Number: [id number]  Attending Physician Name and Address:  Josette Ade, MD  Relative Name and Phone Number:  Devere- mother- [phone]    Current Level of Care: Hospital Recommended Level of Care: Assisted Living Facility, Memory Care Prior Approval Number:    Date Approved/Denied:   PASRR Number:    Discharge Plan: Other (Comment) (ALF Memory Care)    Current Diagnoses: Patient Active Problem List   Diagnosis Date Noted   Hypotension 02/12/2024   AKI (acute kidney injury) 02/12/2024   Sinus bradycardia 02/12/2024   Syncope, vasovagal 02/11/2024   Diarrhea 02/11/2024   Frontotemporal dementia without behavioral disturbance 06/07/2021   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   Cerebral atrophy 12/14/2018   OSA (obstructive sleep apnea) 05/20/2018   Hyperlipidemia, unspecified 06/05/2015   Nephrolithiasis 05/20/2013    Orientation RESPIRATION BLADDER Height & Weight     Self  Normal Incontinent Weight: 96.8 kg Height:  6' (182.9 cm)  BEHAVIORAL SYMPTOMS/MOOD NEUROLOGICAL BOWEL NUTRITION STATUS      Incontinent Diet (regular)  AMBULATORY STATUS COMMUNICATION OF NEEDS Skin   Supervision Verbally Normal                       Personal Care Assistance Level of Assistance  Bathing, Feeding, Dressing Bathing Assistance: Limited assistance Feeding assistance: Limited assistance Dressing Assistance: Limited assistance     Functional Limitations Info  Sight Sight Info: Impaired (glassess)        SPECIAL CARE FACTORS FREQUENCY  PT (By licensed PT), OT (By licensed OT)     PT Frequency: At Facility per facility  eval OT Frequency: at facility per facility eval            Contractures Contractures Info: Not present    Additional Factors Info  Code Status, Allergies Code Status Info: Full Allergies Info: nka           Current Medications (02/12/2024):  This is the current hospital active medication list Current Facility-Administered Medications  Medication Dose Route Frequency Provider Last Rate Last Admin   acetaminophen  (TYLENOL ) tablet 650 mg  650 mg Oral Q6H PRN Paudel, Keshab, MD       Or   acetaminophen  (TYLENOL ) suppository 650 mg  650 mg Rectal Q6H PRN Paudel, Nena, MD       donepezil  (ARICEPT ) tablet 10 mg  10 mg Oral QHS Paudel, Keshab, MD   10 mg at 02/11/24 2211   enoxaparin  (LOVENOX ) injection 40 mg  40 mg Subcutaneous Q24H Paudel, Keshab, MD   40 mg at 02/12/24 9094   memantine  (NAMENDA ) tablet 10 mg  10 mg Oral BID Paudel, Keshab, MD   10 mg at 02/12/24 9095   nortriptyline  (PAMELOR ) capsule 25 mg  25 mg Oral QHS Paudel, Keshab, MD   25 mg at 02/11/24 2211   ondansetron  (ZOFRAN ) tablet 4 mg  4 mg Oral Q6H PRN Paudel, Keshab, MD       Or   ondansetron  (ZOFRAN ) injection 4 mg  4 mg Intravenous Q6H PRN Paudel, Keshab, MD       rosuvastatin  (CRESTOR ) tablet 20 mg  20 mg Oral QHS Paudel, Keshab, MD  20 mg at 02/11/24 2211   venlafaxine  XR (EFFEXOR -XR) 24 hr capsule 75 mg  75 mg Oral Q breakfast Paudel, Keshab, MD   75 mg at 02/12/24 0904     Discharge Medications: Medication List       STOP taking these medications     bisoprolol 5 MG tablet Commonly known as: ZEBETA    olmesartan 40 MG tablet Commonly known as: BENICAR    oxybutynin 10 MG 24 hr tablet Commonly known as: DITROPAN-XL           TAKE these medications     busPIRone 10 MG tablet Commonly known as: BUSPAR Take 10 mg by mouth 2 (two) times daily.    donepezil  10 MG tablet Commonly known as: ARICEPT  Take 10 mg by mouth at bedtime.    loperamide  2 MG tablet Commonly known as: Imodium   A-D Take 1 tablet (2 mg total) by mouth 3 (three) times daily as needed for diarrhea or loose stools.    memantine  10 MG tablet Commonly known as: NAMENDA  Take one tab twice a day What changed:  how much to take how to take this when to take this additional instructions    nortriptyline  25 MG capsule Commonly known as: PAMELOR  Take 25 mg by mouth at bedtime.    rosuvastatin  20 MG tablet Commonly known as: CRESTOR  Take 20 mg by mouth at bedtime.    venlafaxine  XR 75 MG 24 hr capsule Commonly known as: EFFEXOR -XR Take 75 mg by mouth daily with breakfast.        Relevant Imaging Results:  Relevant Lab Results:   Additional Information    Nathanael CHRISTELLA Ring, RN     "

## 2024-02-12 NOTE — TOC Transition Note (Signed)
 Transition of Care [facility]) - Discharge Note   Patient Details  Name: Chad Stone MRN: [id number] Date of Birth: 08/30/1970  [facility] CM/SW Contact:  Nathanael CHRISTELLA Ring, RN Phone Number: 02/12/2024, 4:19 PM   Clinical Narrative:    Patient medically cleared for discharge back to his memory care unit at [facility].  Mother, Chad Stone is coming to pick him up and transport back to the facility.  Called [facility] and notified that he is returning today.  DC summary and FL2 and HH orders printed and put in DC packet to take to the facility.    Final next level of care: Assisted Living Barriers to Discharge: Barriers Resolved   Patient Goals and CMS Choice Patient states their goals for this hospitalization and ongoing recovery are:: unable to state due to level of dementia          Discharge Placement                Patient to be transferred to facility by: Mother- Chad Stone Name of family member notified: Chad Stone Patient and family notified of of transfer: 02/12/24  Discharge Plan and Services Additional resources added to the After Visit Summary for     Discharge Planning Services: CM Consult                                 Social Drivers of Health (SDOH) Interventions SDOH Screenings   Food Insecurity: Patient Unable To Answer (02/11/2024)  Housing: Unknown (02/11/2024)  Transportation Needs: Patient Unable To Answer (02/11/2024)  Utilities: Not At Risk (02/11/2024)  Financial Resource Strain: Low Risk  (11/29/2022)   Received from [facility]  Tobacco Use: Low Risk  (06/02/2023)   Received from [facility]     Readmission Risk Interventions     No data to display

## 2024-02-12 NOTE — Plan of Care (Signed)

## 2024-02-12 NOTE — Hospital Course (Addendum)
 54 y.o. male with medical history significant for cerebral atrophy, dementia, HTN, HLD, glaucoma, OSA on CPAP who was brought in for a syncopal episode while at the bathroom.  Patient had episodes of loose stools and diarrhea. EMS vitals were 97/62, 96.4.  Patient is pleasantly demented and not able to provide meaningful history.  To me he denied any symptoms.   ED Course: Upon arrival to the ED, patient is found to be hypotensive at 85/66 in the ED, bradycardic around 56 to 60 bpm, BUN 21 creatinine 1.53, higher than his baseline around 1.1.  CT head and CT abdomen and pelvis were unremarkable for new findings. Hospitalist service was consulted for evaluation for admission for possible syncopal episode likely due to dehydration and hypotension.  1/29.  Patient not orthostatic.  Held blood pressure medications.  Creatinine improved from 1.53 down to 1.16.  Comprehensive stool panel negative.

## 2024-02-12 NOTE — Assessment & Plan Note (Signed)
 Holding bisoprolol

## 2024-02-12 NOTE — Evaluation (Signed)
 Physical Therapy Evaluation Patient Details Name: Chad Stone MRN: [id number] DOB: Nov 11, 1970 Today's Date: 02/12/2024  History of Present Illness  Chad Stone is a 53yoM who comes to [facility] after vasovagal episode on toilet at facility. PMH: HLD, temporofrontal dementia, HTN, OSA on CPAP. Pt lives in facility at baseline, attends day programs throughout the week.  Clinical Impression  Started with orthotatic VS which are WNL, see below. Pt performs all mobility with low effort, no physical assistance, AMB without device, stands multiple times without LOB. Pt requires heavy multimodal cuing for baseline cognitive impairment. Pt unable to actively have a BM, but does have bowel incontinence during session, formed stool. Pt assisted with pericare and bed change, left sitting up tall with all needs met. Will continue to follow.   Orthostatic VS for the past 24 hrs:  BP- Lying Pulse- Lying BP- Sitting Pulse- Sitting BP- Standing at 0 minutes Pulse- Standing at 0 minutes  02/12/24 1351 116/88 69 113/89 68 133/86 90          If plan is discharge home, recommend the following: Supervision due to cognitive status   Can travel by private vehicle        Equipment Recommendations None recommended by PT  Recommendations for Other Services       Functional Status Assessment Patient has had a recent decline in their functional status and/or demonstrates limited ability to make significant improvements in function in a reasonable and predictable amount of time     Precautions / Restrictions Precautions Precautions: Fall Recall of Precautions/Restrictions: Impaired Restrictions Weight Bearing Restrictions Per Provider Order: No      Mobility  Bed Mobility Overal bed mobility: Independent             General bed mobility comments: requires facilitation for cued tasks, but not for self-motivated movements.    Transfers Overall transfer level: Modified independent Equipment  used: None               General transfer comment: requires facilitation for cued tasks, but not for self-motivated movements.    Ambulation/Gait Ambulation/Gait assistance: Supervision Gait Distance (Feet): 60 Feet Assistive device: None       Pre-gait activities: orthostatic vital signs General Gait Details: requires facilitation for cued tasks, but not for self-motivated movements.  Stairs            Wheelchair Mobility     Tilt Bed    Modified Rankin (Stroke Patients Only)       Balance                                             Pertinent Vitals/Pain Pain Assessment Pain Assessment: No/denies pain    Home Living Family/patient expects to be discharged to:: Assisted living                 Home Equipment: None Additional Comments: [facility]    Prior Function Prior Level of Function : Needs assist             Mobility Comments: patient ambulates without AD with CGA ADLs Comments: requires A due to significant cognitive deficits     Extremity/Trunk Assessment                Communication        Cognition Arousal: Alert Behavior During Therapy: Flat affect  PT - Cognitive impairments: History of cognitive impairments                                 Cueing       General Comments      Exercises Other Exercises Other Exercises: maintains standing in bathroom for ADL assist with pericare Other Exercises: assists with bed mobility for ADL assist with pericare   Assessment/Plan    PT Assessment Patient needs continued PT services  PT Problem List Decreased mobility;Decreased cognition       PT Treatment Interventions Therapeutic exercise;Therapeutic activities;Cognitive remediation    PT Goals (Current goals can be found in the Care Plan section)  Acute Rehab PT Goals PT Goal Formulation: Patient unable to participate in goal setting    Frequency Min  1X/week     Co-evaluation               AM-PAC PT 6 Clicks Mobility  Outcome Measure Help needed turning from your back to your side while in a flat bed without using bedrails?: A Little Help needed moving from lying on your back to sitting on the side of a flat bed without using bedrails?: A Little Help needed moving to and from a bed to a chair (including a wheelchair)?: A Little Help needed standing up from a chair using your arms (e.g., wheelchair or bedside chair)?: A Little Help needed to walk in hospital room?: A Little Help needed climbing 3-5 steps with a railing? : A Little 6 Click Score: 18    End of Session   Activity Tolerance: Patient tolerated treatment well;No increased pain;Patient limited by fatigue Patient left: in bed;with call bell/phone within reach;with bed alarm set Nurse Communication: Mobility status PT Visit Diagnosis: Other abnormalities of gait and mobility (R26.89)    Time: 8666-8581 PT Time Calculation (min) (ACUTE ONLY): 45 min   Charges:   PT Evaluation $PT Eval Moderate Complexity: 1 Mod PT Treatments $Therapeutic Activity: 8-22 mins PT General Charges $$ ACUTE PT VISIT: 1 Visit       2:36 PM, 02/12/24 Peggye JAYSON Linear, PT, DPT Physical Therapist - [facility]  [phone] (ASCOM)    Shayna Eblen C 02/12/2024, 2:33 PM

## 2024-02-12 NOTE — Assessment & Plan Note (Signed)
 Not orthostatic on 1/29.  Continue to hold antihypertensive medications.  Close clinical follow-up on whether to restart blood pressure medications.

## 2024-02-12 NOTE — Progress Notes (Signed)
 " Progress Note   Patient: Chad Stone FMW:[id number] DOB: May 20, 1970 DOA: 02/11/2024     0 DOS: the patient was seen and examined on 02/12/2024   Brief hospital course: 54 y.o. male with medical history significant for cerebral atrophy, dementia, HTN, HLD, glaucoma, OSA on CPAP who was brought in for a syncopal episode while at the bathroom.  Patient had episodes of loose stools and diarrhea. EMS vitals were 97/62, 96.4.  Patient is pleasantly demented and not able to provide meaningful history.  To me he denied any symptoms.   ED Course: Upon arrival to the ED, patient is found to be hypotensive at 85/66 in the ED, bradycardic around 56 to 60 bpm, BUN 21 creatinine 1.53, higher than his baseline around 1.1.  CT head and CT abdomen and pelvis were unremarkable for new findings. Hospitalist service was consulted for evaluation for admission for possible syncopal episode likely due to dehydration and hypotension.  1/29.  Patient not orthostatic.  Held blood pressure medications.  Creatinine improved from 1.53 down to 1.16.  Assessment and Plan: * Syncope, vasovagal Likely secondary to dehydration and hypotension.  Patient not orthostatic on 1/29.  Hypotension Not orthostatic on 1/29.  Continue to hold antihypertensive medications.  Close clinical follow-up on whether to restart blood pressure medications.  Diarrhea Awaiting stool studies before deciding on discharge.  AKI (acute kidney injury) Creatinine improved from 1.53 down to 1.16.  Holding Benicar  Sinus bradycardia Holding bisoprolol  Frontotemporal dementia without behavioral disturbance Patient on Namenda  and Aricept .  Amitriptyline at night  Hyperlipidemia, unspecified On Crestor         Subjective: Patient not the best historian.  Felt okay.  Offers no complaints.  Was not hungry for breakfast but ate a good lunch.  Not orthostatic with physical therapy.  Physical Exam: Vitals:   02/11/24 2031 02/12/24 0528  02/12/24 0800 02/12/24 1254  BP: 104/66 120/86 (!) 120/90 106/80  Pulse: 66 60 71 63  Resp: 18 20 15 16   Temp: 98 F (36.7 C) 98.7 F (37.1 C) 97.8 F (36.6 C) 97.7 F (36.5 C)  TempSrc:      SpO2: 93% 100% 100% 94%  Weight:      Height:       Physical Exam HENT:     Head: Normocephalic.  Eyes:     General: Lids are normal.     Conjunctiva/sclera: Conjunctivae normal.  Cardiovascular:     Rate and Rhythm: Normal rate and regular rhythm.     Heart sounds: Normal heart sounds, S1 normal and S2 normal.  Pulmonary:     Breath sounds: No decreased breath sounds, wheezing, rhonchi or rales.  Abdominal:     Palpations: Abdomen is soft.     Tenderness: There is no abdominal tenderness.  Musculoskeletal:     Right lower leg: No swelling.     Left lower leg: No swelling.  Skin:    General: Skin is warm.     Findings: No rash.  Neurological:     Mental Status: He is alert.     Data Reviewed: Creatinine down to 1.16, electrolytes normal range, GFR greater than 60, white blood cell count 5.1, hemoglobin 14.2, platelet count 262  Family Communication: Spoke with sister over phone  Disposition: Status is: Observation Since he is from a facility and awaiting stool studies prior to deciding on discharge or not  Planned Discharge Destination: Back to his facility    Time spent: 30 minutes  Author: Charlie Patterson,  MD 02/12/2024 3:34 PM  For on call review [email or web address].  "

## 2024-02-12 NOTE — Assessment & Plan Note (Addendum)
 Comprehensive stool studies negative.  Can use Imodium  if needed.

## 2024-02-12 NOTE — Progress Notes (Signed)
 Mobility Specialist - Progress Note  Pre-mobility: HR-106, SpO2-95%  Post-mobility: HR-95,SPO2-98%   02/12/24 1000  Mobility  Activity Stood with assistance;Ambulated with assistance  Level of Assistance Minimal assist, patient does 75% or more  Assistive Device None  Distance Ambulated (ft) 3 ft  Range of Motion/Exercises All extremities  Activity Response Tolerated fair  Mobility visit 1 Mobility  Mobility Specialist Start Time (ACUTE ONLY) 0951  Mobility Specialist Stop Time (ACUTE ONLY) 1002  Mobility Specialist Time Calculation (min) (ACUTE ONLY) 11 min   Pt was supine in bed with the HOB elevated on RA upon entry. Pt agreed to mobility. Pt vitals were checked pre and post activity. Pt is able today to get to the EOB with minA CGA and VC. Pt is able today to STS with minA CGA and VC with 2 [employer]. Pt was able to take a few steps but became unbalanced. Pt vitals were WNL. After activity pt returned to the bed with needs in reach and bed alarm on for safety.  Clem Rodes Mobility Specialist 02/12/24, 4:41 PM

## 2024-02-12 NOTE — Assessment & Plan Note (Signed)
 Patient on Namenda  and Aricept .  Amitriptyline at night

## 2024-02-12 NOTE — Discharge Summary (Signed)
 " Physician Discharge Summary   Patient: Chad Stone MRN: [id number] DOB: 1970/04/11  Admit date:     02/11/2024  Discharge date: 02/12/24  Discharge Physician: Charlie Patterson   PCP: Auston Reyes BIRCH, MD   Recommendations at discharge:   Follow-up PCP 5 days  Discharge Diagnoses: Principal Problem:   Syncope, vasovagal Active Problems:   Hypotension   Diarrhea   AKI (acute kidney injury)   Sinus bradycardia   Frontotemporal dementia without behavioral disturbance   Cerebral atrophy   Hyperlipidemia, unspecified   OSA (obstructive sleep apnea)   Primary progressive aphasia  Resolved Problems:   * No resolved hospital problems. *  Hospital Course: 54 y.o. male with medical history significant for cerebral atrophy, dementia, HTN, HLD, glaucoma, OSA on CPAP who was brought in for a syncopal episode while at the bathroom.  Patient had episodes of loose stools and diarrhea. EMS vitals were 97/62, 96.4.  Patient is pleasantly demented and not able to provide meaningful history.  To me he denied any symptoms.   ED Course: Upon arrival to the ED, patient is found to be hypotensive at 85/66 in the ED, bradycardic around 56 to 60 bpm, BUN 21 creatinine 1.53, higher than his baseline around 1.1.  CT head and CT abdomen and pelvis were unremarkable for new findings. Hospitalist service was consulted for evaluation for admission for possible syncopal episode likely due to dehydration and hypotension.  1/29.  Patient not orthostatic.  Held blood pressure medications.  Creatinine improved from 1.53 down to 1.16.  Comprehensive stool panel negative.  Assessment and Plan: * Syncope, vasovagal Likely secondary to dehydration and hypotension.  Patient not orthostatic on 1/29.  Hypotension Not orthostatic on 1/29.  Continue to hold antihypertensive medications.  Close clinical follow-up on whether to restart blood pressure medications.  Diarrhea Comprehensive stool studies negative.   Can use Imodium  if needed.  AKI (acute kidney injury) Creatinine improved from 1.53 down to 1.16.  Holding Benicar  Sinus bradycardia Holding bisoprolol  Frontotemporal dementia without behavioral disturbance Patient on Namenda  and Aricept .  Amitriptyline at night  Hyperlipidemia, unspecified On Crestor          Consultants: None Procedures performed: None Disposition: Assisted living with home health Diet recommendation:  Regular DISCHARGE MEDICATION: Allergies as of 02/12/2024   No Known Allergies      Medication List     STOP taking these medications    bisoprolol 5 MG tablet Commonly known as: ZEBETA   olmesartan 40 MG tablet Commonly known as: BENICAR   oxybutynin 10 MG 24 hr tablet Commonly known as: DITROPAN-XL       TAKE these medications    busPIRone 10 MG tablet Commonly known as: BUSPAR Take 10 mg by mouth 2 (two) times daily.   donepezil  10 MG tablet Commonly known as: ARICEPT  Take 10 mg by mouth at bedtime.   loperamide  2 MG tablet Commonly known as: Imodium  A-D Take 1 tablet (2 mg total) by mouth 3 (three) times daily as needed for diarrhea or loose stools.   memantine  10 MG tablet Commonly known as: NAMENDA  Take one tab twice a day What changed:  how much to take how to take this when to take this additional instructions   nortriptyline  25 MG capsule Commonly known as: PAMELOR  Take 25 mg by mouth at bedtime.   rosuvastatin  20 MG tablet Commonly known as: CRESTOR  Take 20 mg by mouth at bedtime.   venlafaxine  XR 75 MG 24 hr capsule Commonly known  as: EFFEXOR -XR Take 75 mg by mouth daily with breakfast.        Follow-up Information     Auston Reyes BIRCH, MD Follow up in 5 day(s).   Specialty: Internal Medicine Contact information: [address] [place] [address] [phone]                Discharge Exam: Fredricka Weights   02/11/24 0630  Weight: 96.8 kg   Physical  Exam HENT:     Head: Normocephalic.  Eyes:     General: Lids are normal.     Conjunctiva/sclera: Conjunctivae normal.  Cardiovascular:     Rate and Rhythm: Normal rate and regular rhythm.     Heart sounds: Normal heart sounds, S1 normal and S2 normal.  Pulmonary:     Breath sounds: No decreased breath sounds, wheezing, rhonchi or rales.  Abdominal:     Palpations: Abdomen is soft.     Tenderness: There is no abdominal tenderness.  Musculoskeletal:     Right lower leg: No swelling.     Left lower leg: No swelling.  Skin:    General: Skin is warm.     Findings: No rash.  Neurological:     Mental Status: He is alert.      Condition at discharge: stable  The results of significant diagnostics from this hospitalization (including imaging, microbiology, ancillary and laboratory) are listed below for reference.   Imaging Studies: CT Head Wo Contrast Result Date: 02/11/2024 EXAM: CT HEAD WITHOUT CONTRAST 02/11/2024 03:22:03 AM TECHNIQUE: CT of the head was performed without the administration of intravenous contrast. Automated exposure control, iterative reconstruction, and/or weight based adjustment of the mA/kV was utilized to reduce the radiation dose to as low as reasonably achievable. COMPARISON: Prior examination of 01/03/2020. CLINICAL HISTORY: Facial trauma, blunt. FINDINGS: BRAIN AND VENTRICLES: No acute hemorrhage. No evidence of acute infarct. Advanced asymmetrically frontotemporal cerebral atrophy, significantly progressed since prior examination of 01/03/2020. Ventricular size is moderately enlarged, commensurate with the degree of parenchymal atrophy and likely reflecting ex vacuo dilation. 11 mm calcified meningioma in anterior left frontal lobe. No extra-axial collection. No mass effect or midline shift. ORBITS: No acute abnormality. SINUSES: Mucosal thickening in ethmoid air cells and right maxillary sinus. SOFT TISSUES AND SKULL: No acute soft tissue abnormality. No skull  fracture. IMPRESSION: 1. No acute intracranial abnormality. 2. Advanced asymmetrically frontotemporal cerebral atrophy, significantly progressed. 3. Moderate ventriculomegaly, commensurate with the degree of parenchymal atrophy and likely reflecting ex vacuo dilation. 4. 11 mm calcified meningioma in the anterior left frontal lobe. 5. Ethmoidal and right maxillary sinus disease Electronically signed by: Dorethia Molt MD 02/11/2024 03:46 AM EST RP Workstation: [id number]   CT ABDOMEN PELVIS W CONTRAST Result Date: 02/11/2024 EXAM: CT ABDOMEN AND PELVIS WITH CONTRAST 02/11/2024 03:22:03 AM TECHNIQUE: CT of the abdomen and pelvis was performed with the administration of 100 mL of iohexol  (OMNIPAQUE ) 350 MG/ML injection. Multiplanar reformatted images are provided for review. Automated exposure control, iterative reconstruction, and/or weight-based adjustment of the mA/kV was utilized to reduce the radiation dose to as low as reasonably achievable. COMPARISON: None available. CLINICAL HISTORY: Diarrhea, syncopal episode, and loose stools. Patient with significant dementia and unreliable exam. FINDINGS: LOWER CHEST: Bibasilar atelectasis. LIVER: The liver is unremarkable. GALLBLADDER AND BILE DUCTS: Gallbladder is unremarkable. No biliary ductal dilatation. SPLEEN: No acute abnormality. PANCREAS: No acute abnormality. ADRENAL GLANDS: No acute abnormality. KIDNEYS, URETERS AND BLADDER: Mild bilateral renal cortical scarring. There is an exophytic 13  mm lesion arising from the interpolar region of the right kidney which is indeterminate, measuring 37 Hounsfield units in mean density on portal venous phase imaging and 22 Hounsfield units in intensity on delayed phase imaging. This may represent a hyperdense renal cyst or cystic renal neoplasm and dedicated non-urgent renal mass protocol CT or MRI examination is recommended for further characterization. No stones in the kidneys or ureters. No hydronephrosis. No  perinephric or periureteral stranding. Urinary bladder is unremarkable. GI AND BOWEL: Appendix normal. The stomach, small bowel, and large bowel are otherwise unremarkable. There is no bowel obstruction. PERITONEUM AND RETROPERITONEUM: No ascites. No free air. VASCULATURE: Aorta is normal in caliber. LYMPH NODES: No lymphadenopathy. REPRODUCTIVE ORGANS: No acute abnormality. BONES AND SOFT TISSUES: Remote cancer wedge compression deformity of L1 with approximately 40 - 50% loss of height anteriorly. Osseous structures are age appropriate. No acute bone abnormality. No lytic or blastic bone lesion. No focal soft tissue abnormality. IMPRESSION: 1. No acute abdominopelvic abnormality identified. 2. Indeterminate 13 mm exophytic right interpolar renal lesion, which could represent a hyperdense cyst or a cystic renal neoplasm; recommend nonurgent renal mass protocol MRI (preferred) or CT without and with IV contrast for further characterization. 3. Remote L1 wedge compression deformity with approximately 40-50% anterior height loss. Electronically signed by: Dorethia Molt MD 02/11/2024 03:41 AM EST RP Workstation: [id number]    Microbiology: Results for orders placed or performed during the hospital encounter of 02/11/24  Blood culture (single)     Status: None (Preliminary result)   Collection Time: 02/11/24  2:41 AM   Specimen: BLOOD  Result Value Ref Range Status   Specimen Description BLOOD LEFT ANTECUBITAL  Final   Special Requests   Final    BOTTLES DRAWN AEROBIC AND ANAEROBIC Blood Culture adequate volume   Culture   Final    NO GROWTH 1 DAY Performed at [facility], [address] [place], [place] [address]    Report Status PENDING  Incomplete  Gastrointestinal Panel by PCR , Stool     Status: None   Collection Time: 02/11/24 12:30 PM   Specimen: Stool  Result Value Ref Range Status   Campylobacter species NOT DETECTED NOT DETECTED Final   Plesimonas shigelloides NOT DETECTED NOT  DETECTED Final   Salmonella species NOT DETECTED NOT DETECTED Final   Yersinia enterocolitica NOT DETECTED NOT DETECTED Final   Vibrio species NOT DETECTED NOT DETECTED Final   Vibrio cholerae NOT DETECTED NOT DETECTED Final   Enteroaggregative E coli (EAEC) NOT DETECTED NOT DETECTED Final   Enteropathogenic E coli (EPEC) NOT DETECTED NOT DETECTED Final   Enterotoxigenic E coli (ETEC) NOT DETECTED NOT DETECTED Final   Shiga like toxin producing E coli (STEC) NOT DETECTED NOT DETECTED Final   Shigella/Enteroinvasive E coli (EIEC) NOT DETECTED NOT DETECTED Final   Cryptosporidium NOT DETECTED NOT DETECTED Final   Cyclospora cayetanensis NOT DETECTED NOT DETECTED Final   Entamoeba histolytica NOT DETECTED NOT DETECTED Final   Giardia lamblia NOT DETECTED NOT DETECTED Final   Adenovirus F40/41 NOT DETECTED NOT DETECTED Final   Astrovirus NOT DETECTED NOT DETECTED Final   Norovirus GI/GII NOT DETECTED NOT DETECTED Final   Rotavirus A NOT DETECTED NOT DETECTED Final   Sapovirus (I, II, IV, and V) NOT DETECTED NOT DETECTED Final    Comment: Performed at [facility], [address] [place], [place] [address]    Labs: CBC: Recent Labs  Lab 02/11/24 0110 02/12/24 0609  WBC 10.4 5.1  NEUTROABS 7.4  --   HGB 13.6 14.2  HCT 41.8 45.0  MCV 88.7 93.4  PLT 266 262   Basic Metabolic Panel: Recent Labs  Lab 02/11/24 0110 02/12/24 0609  NA 140 144  K 4.3 4.3  CL 105 107  CO2 26 24  GLUCOSE 90 79  BUN 21* 14  CREATININE 1.53* 1.16  CALCIUM  8.9 9.2   Liver Function Tests: Recent Labs  Lab 02/11/24 0110 02/12/24 0609  AST 15 17  ALT 14 13  ALKPHOS 76 80  BILITOT 0.3 0.5  PROT 6.6 7.0  ALBUMIN 4.0 4.3   CBG: Recent Labs  Lab 02/11/24 2203 02/12/24 1436  GLUCAP 84 70    Discharge time spent: greater than 30 minutes.  Signed: Charlie Patterson, MD [facility] 02/12/2024 "

## 2024-02-12 NOTE — Assessment & Plan Note (Signed)
 Likely secondary to dehydration and hypotension.  Patient not orthostatic on 1/29.

## 2024-02-12 NOTE — Care Management Obs Status (Signed)
 MEDICARE OBSERVATION STATUS NOTIFICATION   Patient Details  Name: Chad Stone MRN: [id number] Date of Birth: 30-Oct-1970   Medicare Observation Status Notification Given:  Yes    Klaira Pesci 02/12/2024, 1:24 PM

## 2024-02-13 LAB — MISC LABCORP TEST (SEND OUT): Labcorp test code: 83935

## 2024-02-16 LAB — CULTURE, BLOOD (SINGLE)
Culture: NO GROWTH
Special Requests: ADEQUATE
